# Patient Record
Sex: Female | Born: 1965 | Race: White | Hispanic: No | Marital: Single | Smoking: Former smoker
Health system: Southern US, Community
[De-identification: ages and names within clinical notes are randomized; demographics above are authoritative.]

## PROBLEM LIST (undated history)

## (undated) DIAGNOSIS — I8390 Asymptomatic varicose veins of unspecified lower extremity: Secondary | ICD-10-CM

## (undated) DIAGNOSIS — T7840XA Allergy, unspecified, initial encounter: Secondary | ICD-10-CM

## (undated) DIAGNOSIS — R0602 Shortness of breath: Secondary | ICD-10-CM

## (undated) DIAGNOSIS — J45909 Unspecified asthma, uncomplicated: Secondary | ICD-10-CM

## (undated) DIAGNOSIS — G43909 Migraine, unspecified, not intractable, without status migrainosus: Secondary | ICD-10-CM

## (undated) DIAGNOSIS — M7989 Other specified soft tissue disorders: Secondary | ICD-10-CM

## (undated) DIAGNOSIS — E559 Vitamin D deficiency, unspecified: Secondary | ICD-10-CM

## (undated) DIAGNOSIS — K5900 Constipation, unspecified: Secondary | ICD-10-CM

## (undated) DIAGNOSIS — F32A Depression, unspecified: Secondary | ICD-10-CM

## (undated) HISTORY — PX: OTHER SURGICAL HISTORY: SHX169

## (undated) HISTORY — PX: TUBAL LIGATION: SHX77

## (undated) HISTORY — DX: Asymptomatic varicose veins of unspecified lower extremity: I83.90

## (undated) HISTORY — DX: Other specified soft tissue disorders: M79.89

## (undated) HISTORY — DX: Vitamin D deficiency, unspecified: E55.9

## (undated) HISTORY — DX: Constipation, unspecified: K59.00

## (undated) HISTORY — DX: Shortness of breath: R06.02

## (undated) HISTORY — DX: Allergy, unspecified, initial encounter: T78.40XA

## (undated) HISTORY — DX: Migraine, unspecified, not intractable, without status migrainosus: G43.909

## (undated) HISTORY — DX: Depression, unspecified: F32.A

## (undated) HISTORY — DX: Unspecified asthma, uncomplicated: J45.909

---

## 1998-01-22 ENCOUNTER — Ambulatory Visit (HOSPITAL_COMMUNITY): Admission: RE | Admit: 1998-01-22 | Discharge: 1998-01-22 | Payer: Self-pay | Admitting: Internal Medicine

## 1999-05-22 ENCOUNTER — Other Ambulatory Visit: Admission: RE | Admit: 1999-05-22 | Discharge: 1999-05-22 | Payer: Self-pay | Admitting: Obstetrics and Gynecology

## 1999-06-24 ENCOUNTER — Encounter: Admission: RE | Admit: 1999-06-24 | Discharge: 1999-09-22 | Payer: Self-pay | Admitting: Obstetrics & Gynecology

## 1999-12-09 ENCOUNTER — Encounter (INDEPENDENT_AMBULATORY_CARE_PROVIDER_SITE_OTHER): Payer: Self-pay

## 1999-12-09 ENCOUNTER — Inpatient Hospital Stay (HOSPITAL_COMMUNITY): Admission: AD | Admit: 1999-12-09 | Discharge: 1999-12-11 | Payer: Self-pay | Admitting: Obstetrics and Gynecology

## 2000-01-22 ENCOUNTER — Other Ambulatory Visit: Admission: RE | Admit: 2000-01-22 | Discharge: 2000-01-22 | Payer: Self-pay | Admitting: Obstetrics and Gynecology

## 2001-04-27 ENCOUNTER — Other Ambulatory Visit: Admission: RE | Admit: 2001-04-27 | Discharge: 2001-04-27 | Payer: Self-pay | Admitting: Obstetrics and Gynecology

## 2002-08-18 ENCOUNTER — Other Ambulatory Visit: Admission: RE | Admit: 2002-08-18 | Discharge: 2002-08-18 | Payer: Self-pay | Admitting: Obstetrics and Gynecology

## 2003-09-26 ENCOUNTER — Other Ambulatory Visit: Admission: RE | Admit: 2003-09-26 | Discharge: 2003-09-26 | Payer: Self-pay | Admitting: Obstetrics and Gynecology

## 2004-06-26 ENCOUNTER — Ambulatory Visit: Payer: Self-pay | Admitting: Internal Medicine

## 2004-08-12 ENCOUNTER — Ambulatory Visit: Payer: Self-pay | Admitting: Internal Medicine

## 2005-05-20 ENCOUNTER — Ambulatory Visit: Payer: Self-pay | Admitting: Internal Medicine

## 2005-07-03 ENCOUNTER — Ambulatory Visit: Payer: Self-pay | Admitting: Internal Medicine

## 2005-10-13 ENCOUNTER — Encounter: Admission: RE | Admit: 2005-10-13 | Discharge: 2005-10-13 | Payer: Self-pay | Admitting: Obstetrics and Gynecology

## 2006-02-12 ENCOUNTER — Ambulatory Visit: Payer: Self-pay | Admitting: Internal Medicine

## 2006-02-26 ENCOUNTER — Ambulatory Visit: Payer: Self-pay | Admitting: Internal Medicine

## 2006-03-30 ENCOUNTER — Ambulatory Visit: Payer: Self-pay | Admitting: Internal Medicine

## 2006-10-15 ENCOUNTER — Encounter: Admission: RE | Admit: 2006-10-15 | Discharge: 2006-10-15 | Payer: Self-pay | Admitting: Obstetrics and Gynecology

## 2006-10-20 ENCOUNTER — Ambulatory Visit: Payer: Self-pay | Admitting: Internal Medicine

## 2007-01-03 DIAGNOSIS — J45909 Unspecified asthma, uncomplicated: Secondary | ICD-10-CM | POA: Insufficient documentation

## 2008-01-11 ENCOUNTER — Ambulatory Visit: Payer: Self-pay | Admitting: Internal Medicine

## 2008-01-11 DIAGNOSIS — F17200 Nicotine dependence, unspecified, uncomplicated: Secondary | ICD-10-CM | POA: Insufficient documentation

## 2008-01-11 DIAGNOSIS — H60399 Other infective otitis externa, unspecified ear: Secondary | ICD-10-CM | POA: Insufficient documentation

## 2008-01-11 DIAGNOSIS — G43909 Migraine, unspecified, not intractable, without status migrainosus: Secondary | ICD-10-CM | POA: Insufficient documentation

## 2008-05-23 ENCOUNTER — Encounter: Admission: RE | Admit: 2008-05-23 | Discharge: 2008-05-23 | Payer: Self-pay | Admitting: Obstetrics and Gynecology

## 2010-10-24 ENCOUNTER — Other Ambulatory Visit: Payer: Self-pay | Admitting: Obstetrics and Gynecology

## 2010-10-24 DIAGNOSIS — R928 Other abnormal and inconclusive findings on diagnostic imaging of breast: Secondary | ICD-10-CM

## 2010-10-29 ENCOUNTER — Ambulatory Visit
Admission: RE | Admit: 2010-10-29 | Discharge: 2010-10-29 | Disposition: A | Discharge status: Home / Self Care | Payer: 59 | Source: Ambulatory Visit | Attending: Obstetrics and Gynecology | Admitting: Obstetrics and Gynecology

## 2010-10-29 DIAGNOSIS — R928 Other abnormal and inconclusive findings on diagnostic imaging of breast: Secondary | ICD-10-CM

## 2010-11-28 NOTE — Op Note (Signed)
[facility]  Patient:    Kara King, Kara King                    MRN: [id number] Proc. Date: 12/10/99 Adm. Date:  [id number] Attending:  Minette Headland                           Operative Report  PREOPERATIVE DIAGNOSIS:       Multiparity, desires sterility.  POSTOPERATIVE DIAGNOSIS:      Multiparity, desires sterility.  OPERATION:                    Postpartum bilateral tubal ligation.  SURGEON:                      Juluis Mire, M.D.  ASSISTANT:  ANESTHESIA:                   Epidural.  ESTIMATED BLOOD LOSS:         Minimal.  PACKS AND DRAINS:             None.  BLOOD REPLACED:               None.  COMPLICATIONS:                None.  INDICATIONS:                  The patient is a 45 year old, gravida 3, now para 3, married white female who desires permanent sterilization after recent delivery. The potential irreversibility of sterilization was explained.  Alternative forms of  birth control have been discussed.  A failure rate of 1 in 200 is quoted. Failures can be in the form of ectopic pregnancy.  The risks of the surgery were discussed including the risks of infection, the risks of hemorrhage that could necessitate transfusion with the risk of AIDS or hepatitis, the risk of injury to adjacent organs that could require further exploratory surgery, the risk of deep venous thrombosis and pulmonary embolus.  DESCRIPTION OF PROCEDURE:     The patient was taken to the OR and placed in the  supine position.  After a satisfactory level of epidural anesthesia was obtained, the abdomen was prepped with Betadine and draped as a sterile field.  A subumbilical incision was made with the knife and carried through the subcutaneous tissue.  The fascia was identified and entered sharply and incision to the fascia extended laterally.  The peritoneum was entered sharply.  There was no evidence of injury to adjacent organs.  The left tube  was first identified and followed out to the fimbriated end.  The midsegment was elevated through the incision.  A whole was made in the avascular area of the mesosalpinx using the Bovie.  Individual ligatures of 0 plain catgut were used to ligate off a segment of tube.  The intervening segment of tube was then excised and the cut ends were cauterized. We had good hemostasis.  The left ovary appeared normal.  Next, the right tube was identified and followed out to its fimbriated end.  A midsegment was elevated through the incision.  A whole was made in the avascular area of the mesosalpinx.  Individual ligatures were used to ligate off a segment of tube.  The intervening segment of tube was then excised.  The cut ends of the tube were cauterized using the Bovie.  We  had excellent hemostasis.  The right ovary  appeared to be normal.  We had good hemostasis at this point.  The fascia was closed with a running suture of 0 Vicryl.  The subcu was closed with 0 Vicryl. The skin was closed with a running subcuticular of 2-0 Vicryl.  Sponge, needle, and  instrument counts were correct by circulating nurse x 2.  The patient tolerated  the procedure well and was returned to the recovery room in good condition. DD:  12/10/99 TD:  12/10/99 Job: [id number]

## 2012-12-12 ENCOUNTER — Telehealth: Payer: Self-pay | Admitting: Internal Medicine

## 2012-12-12 NOTE — Telephone Encounter (Signed)
OK 

## 2012-12-12 NOTE — Telephone Encounter (Signed)
Patient states that it has been awhile since she has seen Dr. Alwyn Ren. She says that Dr. Arelia Sneddon at [facility] wants her to be seen by Dr. Alwyn Ren again. Is it okay for patient to see Dr. Alwyn Ren? Thanks!

## 2012-12-12 NOTE — Telephone Encounter (Signed)
Last OV 2009, Hopp please advise if ok to schedule appointment for patient to re-establish

## 2013-01-02 ENCOUNTER — Encounter: Payer: Self-pay | Admitting: Internal Medicine

## 2013-01-02 ENCOUNTER — Ambulatory Visit (INDEPENDENT_AMBULATORY_CARE_PROVIDER_SITE_OTHER): Payer: BC Managed Care – PPO | Admitting: Internal Medicine

## 2013-01-02 VITALS — BP 110/60 | HR 64 | Temp 98.4°F | Ht 67.0 in | Wt 233.0 lb

## 2013-01-02 DIAGNOSIS — I498 Other specified cardiac arrhythmias: Secondary | ICD-10-CM | POA: Insufficient documentation

## 2013-01-02 DIAGNOSIS — Z0000 Encounter for general adult medical examination without abnormal findings: Secondary | ICD-10-CM

## 2013-01-02 LAB — HEPATIC FUNCTION PANEL
ALT: 12 U/L (ref 0–35)
AST: 15 U/L (ref 0–37)
Alkaline Phosphatase: 42 U/L (ref 39–117)
Bilirubin, Direct: 0 mg/dL (ref 0.0–0.3)
Total Protein: 6.1 g/dL (ref 6.0–8.3)

## 2013-01-02 LAB — CBC WITH DIFFERENTIAL/PLATELET
Basophils Relative: 0.7 % (ref 0.0–3.0)
MCHC: 33.2 g/dL (ref 30.0–36.0)
MCV: 90.2 fl (ref 78.0–100.0)
Monocytes Absolute: 0.3 10*3/uL (ref 0.1–1.0)
Monocytes Relative: 6.6 % (ref 3.0–12.0)
Neutrophils Relative %: 63.9 % (ref 43.0–77.0)
RBC: 4.24 Mil/uL (ref 3.87–5.11)
RDW: 14.2 % (ref 11.5–14.6)

## 2013-01-02 LAB — BASIC METABOLIC PANEL
BUN: 12 mg/dL (ref 6–23)
Calcium: 8.7 mg/dL (ref 8.4–10.5)
Glucose, Bld: 90 mg/dL (ref 70–99)

## 2013-01-02 LAB — LIPID PANEL: HDL: 50.8 mg/dL (ref >39.00)

## 2013-01-02 LAB — T4, FREE: Free T4: 0.83 ng/dL (ref 0.60–1.60)

## 2013-01-02 NOTE — Patient Instructions (Addendum)
Preventive Health Care: Exercise  30-45  minutes a day, 3-4 days a week. Walking is especially valuable in preventing Osteoporosis. Eat a low-fat diet with lots of fruits and vegetables, up to 7-9 servings per day. This would eliminate need for vitamin supplements for most individuals. Avoid obesity; your goal = waist less than 35 inches.Consume less than 30 grams of sugar per day from foods & drinks with High Fructose Corn Syrup as #2,3 or #4 on label. To prevent palpitations or premature beats, avoid stimulants such as decongestants, diet pills, nicotine, or caffeine (coffee, tea, cola, or chocolate) to excess.

## 2013-01-02 NOTE — Progress Notes (Signed)
  Subjective:    Patient ID: Kara King, female    DOB: [birth year], 47 y.o.   MRN: [id number]  HPI  She is here to re-establish primary care;acute issues are noted below.     Review of Systems She is on a modified heart healthy diet; she has been  exercising 35 minutes on a treadmill in past 10 days ;but plans to work with a Psychologist, educational . To date  she denies chest pain,  dyspnea, or claudication with exercise.  She occasionally has palpitations at rest, especially at night. She does not have exercise-induced palpitations. She is on a diet supplement for > 2 months. She denies caffeine intake. In 2008 EKG did reveal unifocal PVC with a bigeminal pattern. There was also poor R wave progression across the precordium. Family history is negative for premature coronary disease .     Objective:   Physical Exam Gen.: Well-nourished in appearance. Alert, appropriate and cooperative throughout exam. Head: Normocephalic without obvious abnormalities Eyes: No corneal or conjunctival inflammation noted. No ptosis ,lid lag or proptosis. Extraocular motion intact. Vision grossly normal without lenses Ears: External  ear exam reveals no significant lesions or deformities. Canals clear .TMs normal. Hearing is grossly normal bilaterally. Nose: External nasal exam reveals no deformity or inflammation. Nasal mucosa are pink and moist. No lesions or exudates noted.   Mouth: Oral mucosa and oropharynx reveal no lesions or exudates. Teeth in good repair. Neck: No deformities, masses, or tenderness noted. Range of motion & Thyroid normal. Lungs: Normal respiratory effort; chest expands symmetrically. Lungs are clear to auscultation without rales, wheezes, or increased work of breathing. Heart: Normal rate and rhythm. Normal S1 and S2. No gallop, click, or rub. S4 w/o murmur. Abdomen: Bowel sounds normal; abdomen soft and nontender. No masses, organomegaly or hernias noted. Genitalia: As per Gyn                                   Musculoskeletal/extremities: No deformity or scoliosis noted of  the thoracic or lumbar spine.  No clubbing, cyanosis, edema, or significant extremity  deformity noted. Range of motion normal .Tone & strength  Normal. Joints normal . Nail health good. Able to lie down & sit up w/o help. Negative SLR bilaterally Vascular: Carotid, radial artery, dorsalis pedis and  posterior tibial pulses are full and equal. No bruits present. Neurologic: Alert and oriented x3. Deep tendon reflexes symmetrical and normal.       Skin: Intact without suspicious lesions or rashes. Lymph: No cervical, axillary lymphadenopathy present. Psych: Mood and affect are normal. Normally interactive                                                                                        Assessment & Plan:  #1 comprehensive physical exam; no acute findings  Plan: see Orders  & Recommendations

## 2013-01-03 ENCOUNTER — Encounter: Payer: Self-pay | Admitting: *Deleted

## 2013-01-17 ENCOUNTER — Ambulatory Visit: Payer: BC Managed Care – PPO | Admitting: Internal Medicine

## 2013-01-18 ENCOUNTER — Ambulatory Visit (INDEPENDENT_AMBULATORY_CARE_PROVIDER_SITE_OTHER)
Admission: RE | Admit: 2013-01-18 | Discharge: 2013-01-18 | Disposition: A | Discharge status: Home / Self Care | Payer: BC Managed Care – PPO | Source: Ambulatory Visit | Attending: Internal Medicine | Admitting: Internal Medicine

## 2013-01-18 ENCOUNTER — Encounter: Payer: Self-pay | Admitting: Internal Medicine

## 2013-01-18 ENCOUNTER — Ambulatory Visit (INDEPENDENT_AMBULATORY_CARE_PROVIDER_SITE_OTHER): Payer: BC Managed Care – PPO | Admitting: Internal Medicine

## 2013-01-18 VITALS — BP 118/72 | HR 61 | Temp 98.6°F | Wt 233.0 lb

## 2013-01-18 DIAGNOSIS — R946 Abnormal results of thyroid function studies: Secondary | ICD-10-CM

## 2013-01-18 DIAGNOSIS — R0601 Orthopnea: Secondary | ICD-10-CM

## 2013-01-18 DIAGNOSIS — R002 Palpitations: Secondary | ICD-10-CM

## 2013-01-18 LAB — D-DIMER, QUANTITATIVE: D-Dimer, Quant: 0.64 ug/mL-FEU — ABNORMAL HIGH (ref 0.00–0.48)

## 2013-01-18 NOTE — Patient Instructions (Addendum)
Order for x-rays entered into  the computer; these will be performed at [address] across from [facility]. No appointment is necessary. 

## 2013-01-18 NOTE — Progress Notes (Signed)
  Subjective:    Patient ID: Kara King, female    DOB: [birth year], 47 y.o.   MRN: [id number]  HPI Her symptoms began 01/12/13 as palpitations which lasted several hours. She was seen in urgent care; no blood work or EKG was performed. It was recommended she return for followup here.  Since that visit; she's had "labored breathing" for several hours every afternoon. She does not describe associated palpitations. It will resolve without treatment  She states that last night she did have some palpitations which were not associated with shortness of breath.  She does have a past history of asthma.  She had extensive lab studies done 01/02/13 which were normal. Her TSH was low normal at 0.4; free T4 was normal . She is no longer taking the  appetite suppressant phentermine.   Review of Systems  She does not have significant cough, wheezing, sputum production,edema , calf pain or hemoptysis.     Objective:   Physical Exam Appears well-nourished & in no acute distress  No carotid bruits are present.No neck pain distention present at 10 - 15 degrees. Thyroid normal to palpation  Heart rhythm and rate are normal with no significant murmurs or gallops.Distant heart sounds  Chest is clear with no increased work of breathing  There is no evidence of aortic aneurysm or renal artery bruits  Abdomen soft with no organomegaly or masses. No HJR  No clubbing, cyanosis or edema present.Homan's negative  Pedal pulses are intact   No ischemic skin changes are present . Nails healthy    Alert and oriented. Strength, tone, DTRs reflexes normal          Assessment & Plan:  #1 orthopnea  #2 paroxysmal palpitations  #3 borderline normal thyroid function tests  Plan: See orders and recommendations. If these studies are negative; pulmonary function test would be recommended.

## 2013-01-19 ENCOUNTER — Encounter: Payer: Self-pay | Admitting: *Deleted

## 2013-01-19 ENCOUNTER — Encounter: Payer: Self-pay | Admitting: Internal Medicine

## 2013-01-19 LAB — T4, FREE: Free T4: 0.91 ng/dL (ref 0.60–1.60)

## 2013-01-19 LAB — T3, FREE: T3, Free: 2 pg/mL — ABNORMAL LOW (ref 2.3–4.2)

## 2013-01-20 ENCOUNTER — Ambulatory Visit (INDEPENDENT_AMBULATORY_CARE_PROVIDER_SITE_OTHER): Payer: BC Managed Care – PPO

## 2013-01-20 DIAGNOSIS — R002 Palpitations: Secondary | ICD-10-CM

## 2013-01-20 NOTE — Progress Notes (Signed)
Placed a 24 hr holter monitor on patient

## 2013-01-29 ENCOUNTER — Encounter: Payer: Self-pay | Admitting: Internal Medicine

## 2013-01-30 ENCOUNTER — Encounter: Payer: Self-pay | Admitting: Internal Medicine

## 2013-05-18 ENCOUNTER — Other Ambulatory Visit: Payer: Self-pay

## 2014-01-25 IMAGING — CR DG CHEST 2V
2 series · 2 of 2 positions shown · non-contrast
Comparison: None.

CLINICAL DATA: Orthopnea, shortness of breath

CHEST - 2 VIEW

[view not recorded (1 of 2)]
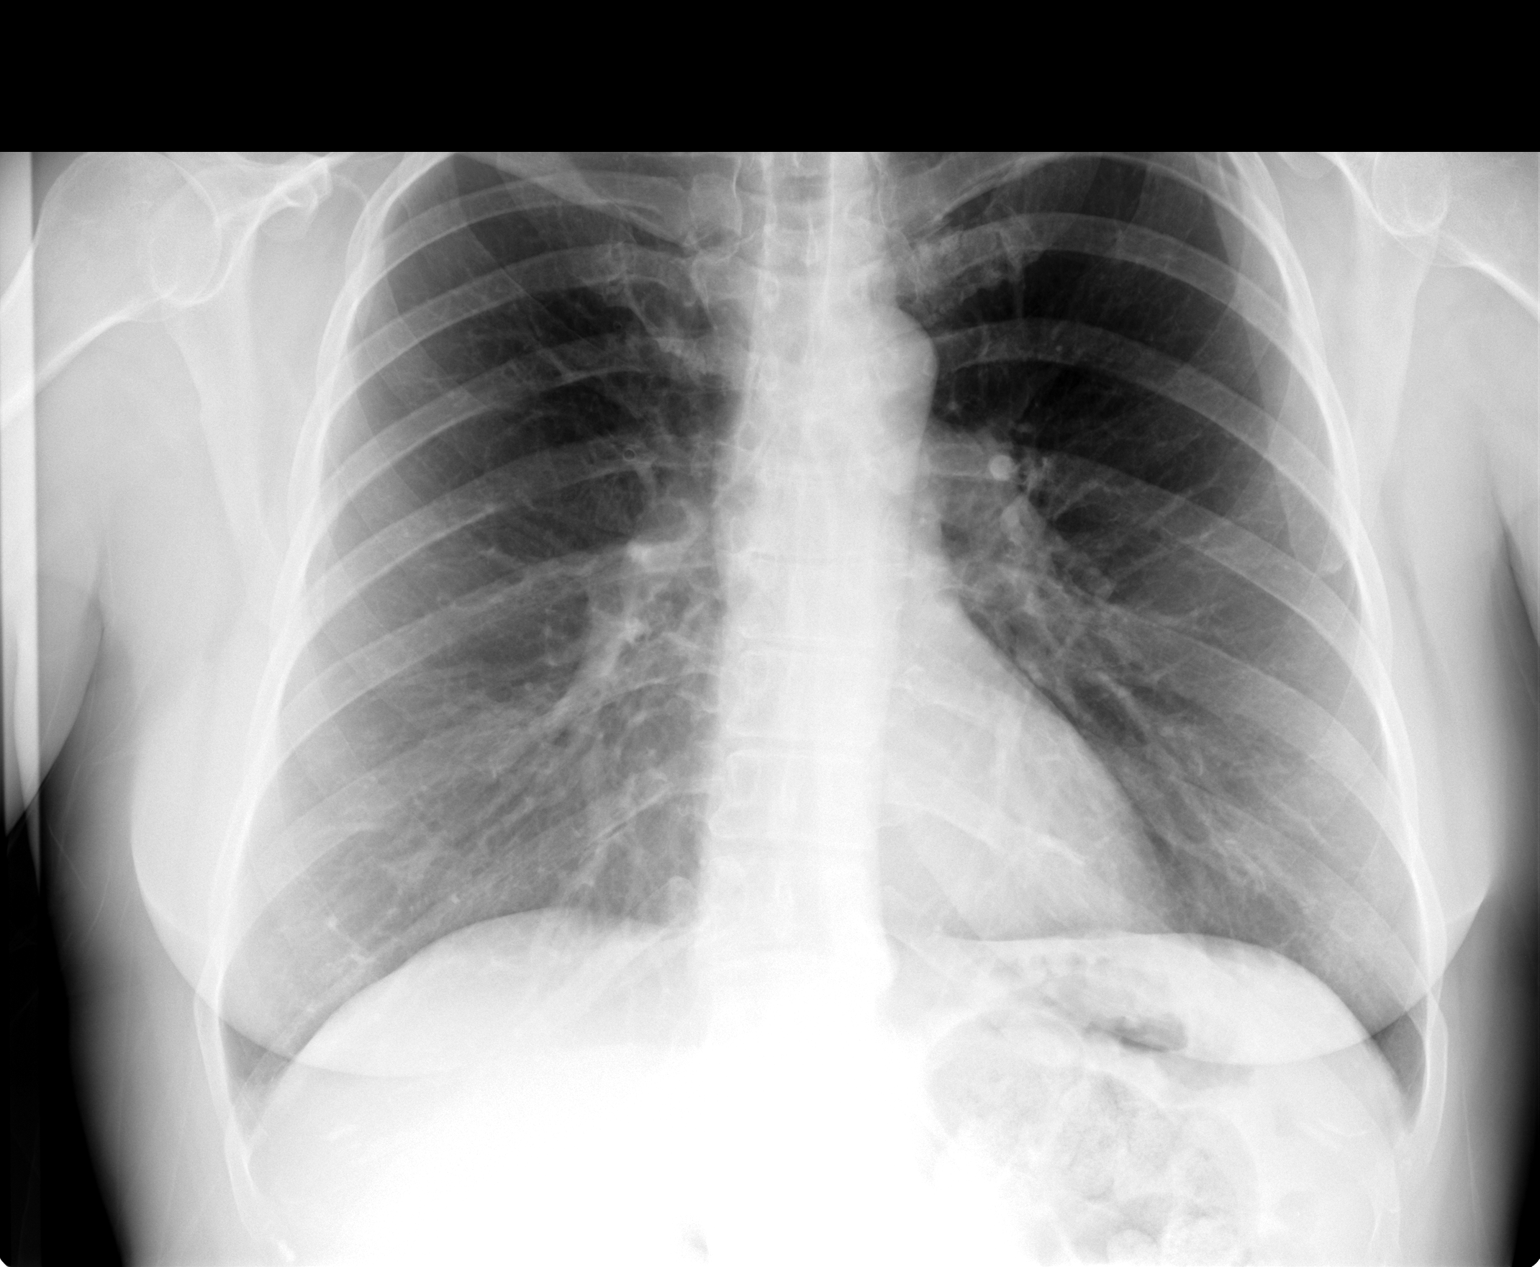

[view not recorded (2 of 2)]
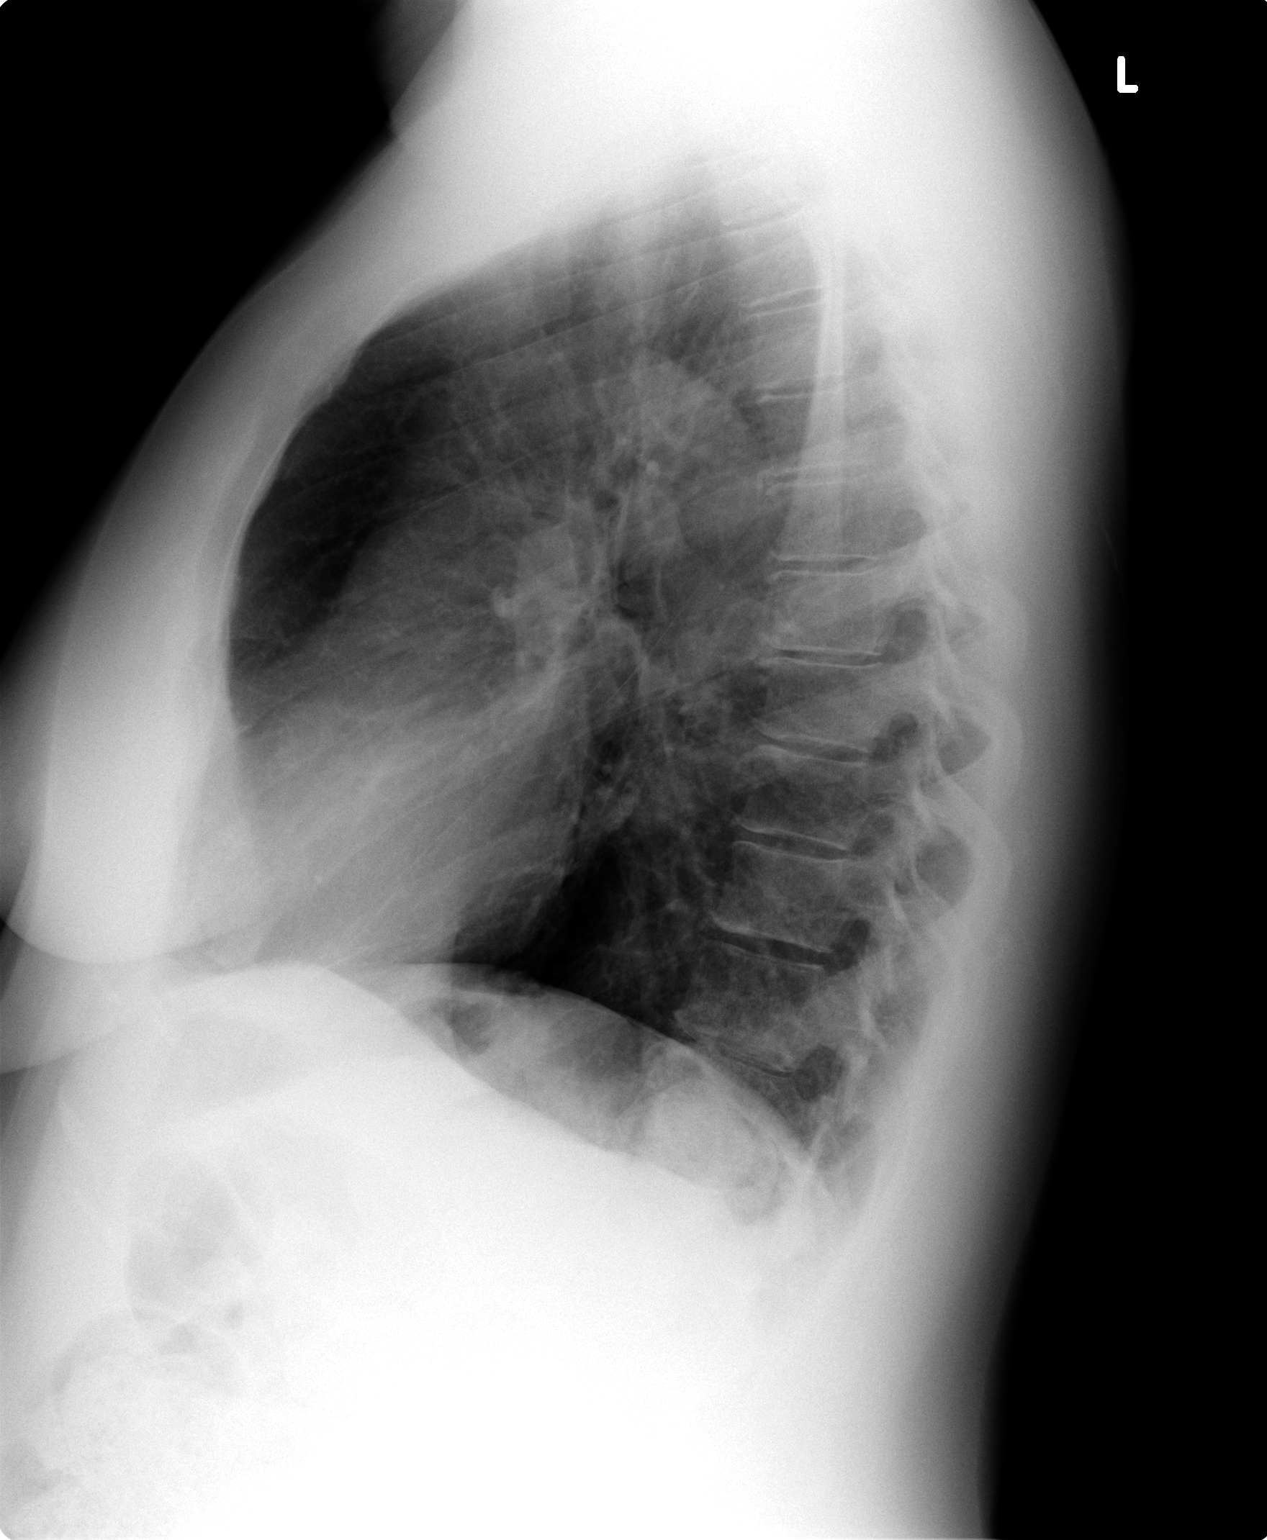

[2 of 2 positions shown; findings below may reference images not displayed]

FINDINGS: Cardiomediastinal silhouette is unremarkable.  No acute
infiltrate or pleural effusion.  No pulmonary edema.  Bony thorax
is unremarkable.
IMPRESSION: No active disease.

## 2015-08-14 LAB — HM MAMMOGRAPHY: HM Mammogram: NORMAL (ref 0–4)

## 2015-08-14 LAB — HM PAP SMEAR: HM PAP: NORMAL

## 2015-09-01 ENCOUNTER — Ambulatory Visit (INDEPENDENT_AMBULATORY_CARE_PROVIDER_SITE_OTHER): Payer: BLUE CROSS/BLUE SHIELD | Admitting: Family Medicine

## 2015-09-01 VITALS — BP 95/60 | HR 50 | Temp 97.3°F | Resp 18 | Ht 67.0 in | Wt 198.8 lb

## 2015-09-01 DIAGNOSIS — J4520 Mild intermittent asthma, uncomplicated: Secondary | ICD-10-CM | POA: Diagnosis not present

## 2015-09-01 DIAGNOSIS — R001 Bradycardia, unspecified: Secondary | ICD-10-CM | POA: Diagnosis not present

## 2015-09-01 DIAGNOSIS — J069 Acute upper respiratory infection, unspecified: Secondary | ICD-10-CM | POA: Diagnosis not present

## 2015-09-01 DIAGNOSIS — J301 Allergic rhinitis due to pollen: Secondary | ICD-10-CM

## 2015-09-01 MED ORDER — PSEUDOEPHEDRINE HCL ER 120 MG PO TB12: 120.0000 mg | ORAL_TABLET | Freq: Two times a day (BID) | ORAL | Status: DC | PRN

## 2015-09-01 MED ORDER — FLUTICASONE PROPIONATE 50 MCG/ACT NA SUSP: 2.0000 | Freq: Every day | NASAL | Status: DC

## 2015-09-01 MED ORDER — AMOXICILLIN 500 MG PO TABS: 1000.0000 mg | ORAL_TABLET | Freq: Two times a day (BID) | ORAL | Status: DC

## 2015-09-01 NOTE — Patient Instructions (Signed)
1  Upper Respiratory Infection, Adult Most upper respiratory infections (URIs) are a viral infection of the air passages leading to the lungs. A URI affects the nose, throat, and upper air passages. The most common type of URI is nasopharyngitis and is typically referred to as "the common cold." URIs run their course and usually go away on their own. Most of the time, a URI does not require medical attention, but sometimes a bacterial infection in the upper airways can follow a viral infection. This is called a secondary infection. Sinus and middle ear infections are common types of secondary upper respiratory infections. Bacterial pneumonia can also complicate a URI. A URI can worsen asthma and chronic obstructive pulmonary disease (COPD). Sometimes, these complications can require emergency medical care and may be life threatening.  CAUSES Almost all URIs are caused by viruses. A virus is a type of germ and can spread from one person to another.  RISKS FACTORS You may be at risk for a URI if:   You smoke.   You have chronic heart or lung disease.  You have a weakened defense (immune) system.   You are very young or very old.   You have nasal allergies or asthma.  You work in crowded or poorly ventilated areas.  You work in health care facilities or schools. SIGNS AND SYMPTOMS  Symptoms typically develop 2-3 days after you come in contact with a cold virus. Most viral URIs last 7-10 days. However, viral URIs from the influenza virus (flu virus) can last 14-18 days and are typically more severe. Symptoms may include:   Runny or stuffy (congested) nose.   Sneezing.   Cough.   Sore throat.   Headache.   Fatigue.   Fever.   Loss of appetite.   Pain in your forehead, behind your eyes, and over your cheekbones (sinus pain).  Muscle aches.  DIAGNOSIS  Your health care provider may diagnose a URI by:  Physical exam.  Tests to check that your symptoms are not  due to another condition such as:  Strep throat.  Sinusitis.  Pneumonia.  Asthma. TREATMENT  A URI goes away on its own with time. It cannot be cured with medicines, but medicines may be prescribed or recommended to relieve symptoms. Medicines may help:  Reduce your fever.  Reduce your cough.  Relieve nasal congestion. HOME CARE INSTRUCTIONS   Take medicines only as directed by your health care provider.   Gargle warm saltwater or take cough drops to comfort your throat as directed by your health care provider.  Use a warm mist humidifier or inhale steam from a shower to increase air moisture. This may make it easier to breathe.  Drink enough fluid to keep your urine clear or pale yellow.   Eat soups and other clear broths and maintain good nutrition.   Rest as needed.   Return to work when your temperature has returned to normal or as your health care provider advises. You may need to stay home longer to avoid infecting others. You can also use a face mask and careful hand washing to prevent spread of the virus.  Increase the usage of your inhaler if you have asthma.   Do not use any tobacco products, including cigarettes, chewing tobacco, or electronic cigarettes. If you need help quitting, ask your health care provider. PREVENTION  The best way to protect yourself from getting a cold is to practice good hygiene.   Avoid oral or hand contact with people  with cold symptoms.   Wash your hands often if contact occurs.  There is no clear evidence that vitamin C, vitamin E, echinacea, or exercise reduces the chance of developing a cold. However, it is always recommended to get plenty of rest, exercise, and practice good nutrition.  SEEK MEDICAL CARE IF:   You are getting worse rather than better.   Your symptoms are not controlled by medicine.   You have chills.  You have worsening shortness of breath.  You have brown or red mucus.  You have yellow or  brown nasal discharge.  You have pain in your face, especially when you bend forward.  You have a fever.  You have swollen neck glands.  You have pain while swallowing.  You have white areas in the back of your throat. SEEK IMMEDIATE MEDICAL CARE IF:   You have severe or persistent:  Headache.  Ear pain.  Sinus pain.  Chest pain.  You have chronic lung disease and any of the following:  Wheezing.  Prolonged cough.  Coughing up blood.  A change in your usual mucus.  You have a stiff neck.  You have changes in your:  Vision.  Hearing.  Thinking.  Mood. MAKE SURE YOU:   Understand these instructions.  Will watch your condition.  Will get help right away if you are not doing well or get worse.   This information is not intended to replace advice given to you by your health care provider. Make sure you discuss any questions you have with your health care provider.   Document Released: 12/23/2000 Document Revised: 11/13/2014 Document Reviewed: 10/04/2013 Elsevier Interactive Patient Education [employer].

## 2015-09-01 NOTE — Progress Notes (Signed)
Subjective:    Patient ID: Kara King, female    DOB: 11-Jul-1966, 50 y.o.   MRN: [id number]  09/01/2015  Shortness of Breath and Cough   HPI This 50 y.o. female presents for evaluation of cough, shortness of breath.  History of Asthma.  Onset five days ago.  No fever/chills/sweats.  No body aches.  No headache.  No ear pain or sore throat.  Ears are congested.  +rhinorrhea; +nasal congestion; +sneezing.  Congestion is clear.  +coughing mild.  +PND.  +no wheezing.  +SOB; onset yesterday. No chest tightness.  No v/d.  Allergies as a child but have been good for the past several years.  SOB occurs with exertion.  No medications for symptoms. No tobacco.  History of tobacco abuse; quit five years ago.    Heart rate on FitBit in 50s for several months.  Does not check BP; denies dizziness.  Feels fine.  Review of Systems  Constitutional: Negative for fever, chills, diaphoresis and fatigue.  HENT: Positive for congestion, postnasal drip, rhinorrhea and sneezing. Negative for ear pain, sinus pressure, sore throat and trouble swallowing.   Respiratory: Positive for cough and shortness of breath. Negative for chest tightness and wheezing.   Cardiovascular: Negative for chest pain, palpitations and leg swelling.  Gastrointestinal: Negative for nausea, vomiting, abdominal pain, diarrhea and constipation.  Neurological: Negative for dizziness and headaches.    Past Medical History  Diagnosis Date  . Migraine headache   . Asthma   . Allergy    Past Surgical History  Procedure Laterality Date  . Tubal ligation    . G 3 p 3     Allergies  Allergen Reactions  . Wellbutrin [Bupropion]     01/30/13 dyspnea (MyChart message)    Social History   Social History  . Marital Status: Single    Spouse Name: N/A  . Number of Children: N/A  . Years of Education: N/A   Occupational History  . Not on file.   Social History Main Topics  . Smoking status: Former Smoker    Types: Cigarettes      Quit date: 01/03/2011  . Smokeless tobacco: Not on file     Comment: smoked age [age] (not with pregnancies , up to 2 ppd  . Alcohol Use: No  . Drug Use: No  . Sexual Activity: No   Other Topics Concern  . Not on file   Social History Narrative   Family History  Problem Relation Age of Onset  . Adopted: Yes  . Alcohol abuse Father   . Heart attack Brother 42     smoker  . Heart disease Brother   . Hypertension Brother   . Uterine cancer Maternal Grandmother   . Cancer Maternal Grandmother   . Stroke Paternal Grandmother     > 65  . Diabetes Paternal Grandmother        Objective:    BP 95/60 mmHg  Pulse 50  Temp(Src) 97.3 F (36.3 C) (Oral)  Resp 18  Ht 5\' 7"  (1.702 m)  Wt 198 lb 12.8 oz (90.175 kg)  BMI 31.13 kg/m2  SpO2 98%  LMP 08/07/2015 Physical Exam  Constitutional: She is oriented to person, place, and time. She appears well-developed and well-nourished. No distress.  HENT:  Head: Normocephalic and atraumatic.  Right Ear: Tympanic membrane, external ear and ear canal normal.  Left Ear: Tympanic membrane, external ear and ear canal normal.  Nose: Nose normal.  Eyes: Conjunctivae are normal. Pupils  are equal, round, and reactive to light.  Neck: Normal range of motion. Neck supple. No thyromegaly present.  Cardiovascular: Regular rhythm and normal heart sounds.  Bradycardia present.  Exam reveals no gallop and no friction rub.   No murmur heard. Pulmonary/Chest: Effort normal and breath sounds normal. No respiratory distress. She has no wheezes. She has no rales.  Lymphadenopathy:    She has no cervical adenopathy.  Neurological: She is alert and oriented to person, place, and time.  Skin: She is not diaphoretic.  Psychiatric: She has a normal mood and affect. Her behavior is normal.  Nursing note and vitals reviewed.  Results for orders placed or performed in visit on 01/18/13  TSH  Result Value Ref Range   TSH 0.87 0.35 - 5.50 uIU/mL  T4, free   Result Value Ref Range   Free T4 0.91 0.60 - 1.60 ng/dL  T3, free  Result Value Ref Range   T3, Free 2.0 (L) 2.3 - 4.2 pg/mL  D-dimer, quantitative  Result Value Ref Range   D-Dimer, Quant 0.64 (H) 0.00 - 0.48 ug/mL-FEU   PEAK FLOW 450.    Assessment & Plan:   1. Acute upper respiratory infection   2. Allergic rhinitis due to pollen   3. Asthma, mild intermittent, uncomplicated   4. Bradycardia    1. URI: New.  Treat supportively with Pseudoephedrine.  If no improvement in 3-5 days, start amoxicillin. Consistent with viral syndrome at this time. 2.  Allergic Rhinitis: New. Rx for Flonase provided; cannot tolerate antihistamines. 3.  Asthma mild intermittent: stable; current mild exacerbation; declined Albuterol PRN.  Pt declined CBC today to rule out anemia. 4.  Bradycardia: New. Asymptomatic.  Recommend follow-up with PCP for labs including thyroid panel; patient agreeable; declined labs today.    No orders of the defined types were placed in this encounter.   Meds ordered this encounter  Medications  . pseudoephedrine (SUDAFED) 120 MG 12 hr tablet    Sig: Take 1 tablet (120 mg total) by mouth every 12 (twelve) hours as needed for congestion.    Dispense:  14 tablet    Refill:  0  . amoxicillin (AMOXIL) 500 MG tablet    Sig: Take 2 tablets (1,000 mg total) by mouth 2 (two) times daily.    Dispense:  40 tablet    Refill:  0  . fluticasone (FLONASE) 50 MCG/ACT nasal spray    Sig: Place 2 sprays into both nostrils daily.    Dispense:  16 g    Refill:  6    No Follow-up on file.    Kristi Elayne Guerin, M.D. [facility] [address] [place], [place]  [address] [phone] phone [phone] fax

## 2015-09-19 ENCOUNTER — Other Ambulatory Visit: Payer: Self-pay | Admitting: *Deleted

## 2015-09-19 DIAGNOSIS — I83813 Varicose veins of bilateral lower extremities with pain: Secondary | ICD-10-CM

## 2015-09-30 ENCOUNTER — Encounter: Payer: Self-pay | Admitting: Surgery

## 2015-10-04 ENCOUNTER — Ambulatory Visit (INDEPENDENT_AMBULATORY_CARE_PROVIDER_SITE_OTHER): Payer: BLUE CROSS/BLUE SHIELD | Admitting: Surgery

## 2015-10-04 ENCOUNTER — Encounter: Payer: Self-pay | Admitting: Surgery

## 2015-10-04 ENCOUNTER — Ambulatory Visit (HOSPITAL_COMMUNITY)
Admission: RE | Admit: 2015-10-04 | Discharge: 2015-10-04 | Disposition: A | Discharge status: Home / Self Care | Payer: BLUE CROSS/BLUE SHIELD | Source: Ambulatory Visit | Attending: Surgery | Admitting: Surgery

## 2015-10-04 VITALS — BP 116/55 | HR 41 | Temp 97.9°F | Resp 16 | Ht 66.0 in | Wt 188.0 lb

## 2015-10-04 DIAGNOSIS — I872 Venous insufficiency (chronic) (peripheral): Secondary | ICD-10-CM | POA: Diagnosis not present

## 2015-10-04 DIAGNOSIS — I83813 Varicose veins of bilateral lower extremities with pain: Secondary | ICD-10-CM

## 2015-10-04 NOTE — Progress Notes (Signed)
Patient name: Kara King MRN: [id number] DOB: [birth year] Sex: female   Referred by: self  Reason for referral:  Chief Complaint  Patient presents with  . New Evaluation    bilateral varicose veins    HISTORY OF PRESENT ILLNESS: Is is a 50 year old female who comes in today for evaluation of varicose veins.  The patient states that her legs have been bothering her for many years, the left greater than right.  We are associated with swelling and aching, particularly when she has been on her feet for long time.  The swelling also happens when she dangles her legs.  She has a prominent varicosity on the right lateral calf with spider veins on the left lateral calf and a varicosity on the anterior left lower leg.  She denies a history of DVT.  Past Medical History  Diagnosis Date  . Migraine headache   . Asthma   . Allergy   . Varicose veins     Past Surgical History  Procedure Laterality Date  . Tubal ligation    . G 3 p 3      Social History   Social History  . Marital Status: Single    Spouse Name: N/A  . Number of Children: N/A  . Years of Education: N/A   Occupational History  . Not on file.   Social History Main Topics  . Smoking status: Former Smoker    Types: Cigarettes    Quit date: 01/03/2011  . Smokeless tobacco: Never Used     Comment: smoked age [age] (not with pregnancies , up to 2 ppd  . Alcohol Use: No  . Drug Use: No  . Sexual Activity: No   Other Topics Concern  . Not on file   Social History Narrative    Family History  Problem Relation Age of Onset  . Adopted: Yes  . Alcohol abuse Father   . Heart attack Brother 42     smoker  . Heart disease Brother   . Hypertension Brother   . Uterine cancer Maternal Grandmother   . Cancer Maternal Grandmother   . Stroke Paternal Grandmother     > 65  . Diabetes Paternal Grandmother     Allergies as of 10/04/2015 - Review Complete 10/04/2015  Allergen Reaction Noted  . Wellbutrin  [bupropion]  01/30/2013    Current Outpatient Prescriptions on File Prior to Visit  Medication Sig Dispense Refill  . rizatriptan (MAXALT) 10 MG tablet Take 10 mg by mouth as needed for migraine. Reported on 09/01/2015    . amoxicillin (AMOXIL) 500 MG tablet Take 2 tablets (1,000 mg total) by mouth 2 (two) times daily. (Patient not taking: Reported on 10/04/2015) 40 tablet 0  . buPROPion (WELLBUTRIN XL) 300 MG 24 hr tablet Take 300 mg by mouth daily. Reported on 10/04/2015    . fluticasone (FLONASE) 50 MCG/ACT nasal spray Place 2 sprays into both nostrils daily. (Patient not taking: Reported on 10/04/2015) 16 g 6  . pseudoephedrine (SUDAFED) 120 MG 12 hr tablet Take 1 tablet (120 mg total) by mouth every 12 (twelve) hours as needed for congestion. (Patient not taking: Reported on 10/04/2015) 14 tablet 0   No current facility-administered medications on file prior to visit.     REVIEW OF SYSTEMS: Cardiovascular: No chest pain, chest pressure,Positive for shortness of breath with activity area positive for leg swelling Pulmonary: No productive cough, asthma or wheezing. Neurologic: No weakness, paresthesias, aphasia, or amaurosis. No dizziness. Hematologic:  No bleeding problems or clotting disorders. Musculoskeletal: No joint pain or joint swelling. Gastrointestinal: No blood in stool or hematemesis Genitourinary: No dysuria or hematuria. Psychiatric:: No history of major depression. Integumentary: No rashes or ulcers. Constitutional: No fever or chills.  PHYSICAL EXAMINATION:  Filed Vitals:   10/04/15 1114  BP: 116/55  Pulse: 41  Temp: 97.9 F (36.6 C)  Resp: 16  Height: 5\' 6"  (1.676 m)  Weight: 188 lb (85.276 kg)  SpO2: 99%   Body mass index is 30.36 kg/(m^2). General: The patient appears their stated age.   HEENT:  No gross abnormalities Pulmonary: Respirations are non-labored Musculoskeletal: There are no major deformities.   Neurologic: No focal weakness or paresthesias are  detected, Skin: There are no ulcer or rashes noted. Psychiatric: The patient has normal affect. Cardiovascular: There is a regular rate and rhythm without significant murmur appreciated.Palpable pedal pulses.  Varicosity along the right lateral calf.  Spider veins on the left lateral calf with a varicosity anteriorly Palpable pedal pulses Diagnostic Studies: I have reviewed her duplex ultrasound.  There is no evidence of DVT.  Venous incompetence is noted in bilateral common femoral vein, the right saphenofemoral junction, and the left popliteal vein.  There is also a possible a incompetent perforator in the right posterior lateral distal thigh with adjacent varicosities    Assessment:  Chronic venous insufficiency Plan: I discussed with the patient that she does not have significant superficial venous reflux to warrant endovenous laser ablation.  The spider vein and varicosities could be treated on a cosmetic basis.  She is going to contemplate this.  I have asked her to start wearing compression stockings so as to minimize complications from chronic venous insufficiency.  She'll contact us on an as-needed basis if she wants to pursue treatment of her venous issues.     Eldridge Abrahams, M.D. [facility] Office: [phone] Pager:  [phone]

## 2016-08-12 ENCOUNTER — Telehealth: Payer: Self-pay | Admitting: Internal Medicine

## 2016-08-12 NOTE — Telephone Encounter (Signed)
Patient is a hopper patient her daughter see you and is wanting to transfer to you. Please advise

## 2016-08-13 NOTE — Telephone Encounter (Signed)
Ok for her --- but unable to take anymore new

## 2016-09-21 ENCOUNTER — Ambulatory Visit (INDEPENDENT_AMBULATORY_CARE_PROVIDER_SITE_OTHER): Payer: BLUE CROSS/BLUE SHIELD | Admitting: Family Medicine

## 2016-09-21 ENCOUNTER — Encounter: Payer: Self-pay | Admitting: Family Medicine

## 2016-09-21 VITALS — BP 102/62 | HR 46 | Temp 98.3°F | Ht 66.0 in | Wt 209.2 lb

## 2016-09-21 DIAGNOSIS — S76302A Unspecified injury of muscle, fascia and tendon of the posterior muscle group at thigh level, left thigh, initial encounter: Secondary | ICD-10-CM

## 2016-09-21 DIAGNOSIS — R001 Bradycardia, unspecified: Secondary | ICD-10-CM

## 2016-09-21 NOTE — Progress Notes (Signed)
Musculoskeletal Exam  Patient: Kara King DOB: May 14, 1966  DOS: 09/21/2016  SUBJECTIVE:  Chief Complaint:   Chief Complaint  Patient presents with  . Knee Pain    (L)-since Sept-pt states possible injury    Kara King is a 51 y.o.  female for evaluation and treatment of L knee pain. She is a new patient who was formerly seen by Dr. Linna Darner, who retired. She is going to establish with Dr. Carollee Herter in April, but wanted to be seen prior to that for her knee.  Onset:  6 months ago. No injury, she had recently started doing boot camp workouts that were higher intensity than her yoga that she was doing previously.  Location: L posterior (medial) knee Character:  aching  Progression of issue:  has worsened slightly Associated symptoms: None- it does not catch or lock on her, she is not having any bruising or swelling Palliation: rest, avoidance of activity Provocation: Boot camp workout Treatment: to date has been activity modification.   Neurovascular symptoms: no  Bradycardia Has always been told her heart rate is low. She is not on a beta blocker. She is not having any chest pain or shortness of breath.   ROS: Musculoskeletal/Extremities: +L knee pain Neurologic: no numbness, tingling no weakness   Past Medical History:  Diagnosis Date  . Allergy   . Asthma   . Migraine headache   . Varicose veins    Past Surgical History:  Procedure Laterality Date  . G 3 P 3    . TUBAL LIGATION     Family History  Problem Relation Age of Onset  . Adopted: Yes  . Alcohol abuse Father   . Heart attack Brother 42     smoker  . Heart disease Brother   . Hypertension Brother   . Uterine cancer Maternal Grandmother   . Cancer Maternal Grandmother   . Stroke Paternal Grandmother     > 65  . Diabetes Paternal Grandmother    Current Outpatient Prescriptions  Medication Sig Dispense Refill  . Multiple Vitamin (MULTIVITAMIN) tablet Take 1 tablet by mouth daily.    .  rizatriptan (MAXALT) 10 MG tablet Take 10 mg by mouth as needed for migraine. Reported on 09/01/2015     Allergies  Allergen Reactions  . Wellbutrin [Bupropion]     01/30/13 dyspnea (MyChart message)   Social History   Social History  . Marital status: Single   Social History Main Topics  . Smoking status: Former Smoker    Types: Cigarettes    Quit date: 01/03/2011  . Smokeless tobacco: Never Used     Comment: smoked age [age] (not with pregnancies , up to 2 ppd  . Alcohol use No  . Drug use: No  . Sexual activity: No   Objective: VITAL SIGNS: BP 102/62 (BP Location: Left Arm, Patient Position: Sitting, Cuff Size: Large)   Pulse (!) 46   Temp 98.3 F (36.8 C) (Oral)   Ht 5\' 6"  (1.676 m)   Wt 209 lb 3.2 oz (94.9 kg)   LMP 08/26/2016 (Exact Date)   SpO2 98%   BMI 33.77 kg/m  Constitutional: Well formed, well developed. No acute distress. Cardiovascular: Reg rhythm, bradycardic, no bruits, no LE edema Thorax & Lungs: CTAB. No accessory muscle use Extremities: No clubbing. No cyanosis. No edema.  Skin: Warm. Dry. No erythema. No rash.  Musculoskeletal: L knee.   Normal active range of motion: yes.   Normal passive range of motion: yes Tenderness  to palpation: yes over medial distal hamstring; there was also pain illicited with knee flexion over same area Effusion: No Deformity: no Ecchymosis: no Tests positive: None Tests negative: Patellar grind/apprehension, Lachman's, anterior/posterior drawer, McMurray's, Stine's,  Neurologic: Normal sensory function. No focal deficits noted.  Psychiatric: Normal mood. Age appropriate judgment and insight. Alert & oriented x 3.    Assessment:  Left hamstring injury, initial encounter - Plan: Ambulatory referral to Physical Therapy  Plan: Orders as above. As this has been going on for 6 mo, will refer to PT. Home stretches and exercises given in meantime. Do what can tolerate only. Ibuprofen/acetaminophen for pain. Try to stay  active.  F/u prn or with reg future PCP otherwise.  The patient voiced understanding and agreement to the plan.   [place], DO 09/21/16  8:38 AM

## 2016-09-21 NOTE — Progress Notes (Signed)
Pre visit review using our clinic review tool, if applicable. No additional management support is needed unless otherwise documented below in the visit note. 

## 2016-09-21 NOTE — Patient Instructions (Addendum)
If you don't hear anything in the next 1-2 weeks about physical therapy, call us to ask for an update.   Hamstring Strain Rehab Ask your health care provider which exercises are safe for you. Do exercises exactly as told by your health care provider and adjust them as directed. It is normal to feel mild stretching, pulling, tightness, or discomfort as you do these exercises, but you should stop right away if you feel sudden pain or your pain gets worse.Do not begin these exercises until told by your health care provider. Strengthening exercises These exercises build strength and endurance in your thighs. Endurance is the ability to use your muscles for a long time, even after your muscles get tired. Exercise A: Straight leg raises (  hip extensors) 1. Lie on your belly on a firm surface. 2. Tense the muscles in your buttocks to lift your left / right leg about 4 inches (10 cm). Keep your knee straight. 3. If you cannot lift your leg this high without arching your back, place a pillow under your hips. 4. Hold the position for 15 seconds. 5. Slowly lower your leg to the starting position and allow it to relax completely before you start the next repetition. Repeat 2-3 times. Complete this exercise one time a day. Exercise B: Bridge ( hip extensors) 1. Lie on your back on a firm surface with your knees bent and your feet flat on the floor. 2. Tighten your buttocks muscles and lift your bottom off the floor until your trunk is level with your thighs.  You should feel the muscles working in your buttocks and the back of your thighs.  Do not arch your back. 3. Hold this position for 15 seconds. 4. Slowly lower your hips to the starting position. 5. Let your buttocks muscles relax completely between repetitions. Repeat 2-3 times. Complete this exercise 1 time a day. If told by your health care provider, keep your bottom lifted off the floor while you slowly walk your feet away from you as far as  you can control. Hold for 15 seconds, then slowly walk your feet back toward you. Exercise C: Lateral walking with band ( hip abductors) 1. Stand in a long hallway. 2. Wrap a loop of exercise band around your legs, just above your knees. 3. Bend your knees gently and drop your hips down and back so your weight is over your heels. 4. Step to the side to move down the length of the hallway, keeping your toes pointed ahead of you and keeping tension in the band. 5. Repeat, leading with your other leg. Repeat 2times. Complete this exercise every other day. Exercise D: Single leg stand with reaching ( eccentric hamstring) 1. Stand on your left / right foot. Keep your big toe down on the floor and try to keep your arch lifted. 2. Slowly reach down toward the floor as far as you can while keeping your balance. 3. Hold this position for 10-15 seconds. Repeat 2-3 times. Complete this exercise every other day. Exercise E: Prone plank (abdominals and core) 1. Lie on your belly on the floor and prop yourself up on your elbows. Your hands should be straight out in front of you, and your elbows should be below your shoulders. Position your feet so the balls of your feet touch the ground. The ball of your foot is on the walking surface, right under your toes. 2. Tighten your abdominal muscles and lift your body off the floor.  Do  not arch your back.  Do not hold your breath. 3. Hold this position for 10-15 seconds. Repeat 2-3 times. Complete this exercise every other day. This information is not intended to replace advice given to you by your health care provider. Make sure you discuss any questions you have with your health care provider. Document Released: 06/29/2005 Document Revised: 03/05/2016 Document Reviewed: 03/28/2015 Elsevier Interactive Patient Education  2017 [employer].

## 2016-10-15 ENCOUNTER — Encounter: Payer: Self-pay | Admitting: Behavioral Health

## 2016-10-15 ENCOUNTER — Telehealth: Payer: Self-pay | Admitting: Behavioral Health

## 2016-10-15 NOTE — Telephone Encounter (Signed)
Pre-Visit Call completed with patient and chart updated.   Pre-Visit Info documented in Specialty Comments under [employer].    

## 2016-10-16 ENCOUNTER — Ambulatory Visit (INDEPENDENT_AMBULATORY_CARE_PROVIDER_SITE_OTHER): Payer: BLUE CROSS/BLUE SHIELD | Admitting: Family Medicine

## 2016-10-16 ENCOUNTER — Encounter: Payer: Self-pay | Admitting: Family Medicine

## 2016-10-16 VITALS — BP 108/66 | HR 57 | Temp 98.4°F | Resp 16 | Ht 66.0 in | Wt 213.6 lb

## 2016-10-16 DIAGNOSIS — Z23 Encounter for immunization: Secondary | ICD-10-CM | POA: Diagnosis not present

## 2016-10-16 DIAGNOSIS — G43909 Migraine, unspecified, not intractable, without status migrainosus: Secondary | ICD-10-CM | POA: Diagnosis not present

## 2016-10-16 DIAGNOSIS — F320 Major depressive disorder, single episode, mild: Secondary | ICD-10-CM

## 2016-10-16 MED ORDER — VENLAFAXINE HCL ER 37.5 MG PO CP24: ORAL_CAPSULE | ORAL | 0 refills | Status: DC

## 2016-10-16 NOTE — Progress Notes (Signed)
Pre visit review using our clinic review tool, if applicable. No additional management support is needed unless otherwise documented below in the visit note. 

## 2016-10-16 NOTE — Progress Notes (Signed)
Patient ID: Kara King, female   DOB: 1966-03-29, 51 y.o.   MRN: [id number]     Subjective:  I acted as a Education administrator for Dr. Carollee Herter.  Guerry Bruin, [place]    Patient ID: Kara King, female    DOB: 07-15-1965, 51 y.o.   MRN: [id number]  Chief Complaint  Patient presents with  . transfer from Dr. Linna Darner  . would like to start antidepressant  . Migraine    HPI  Patient is in today to establish care.  She is a transfer from Dr. Linna Darner.   She has a history of migraines.  She has been waking up with migraines.  She has been getting about 4 migraines out of a month.   Has been taking Excedrin every day and  because of pain.  She would like to try an antidepressant because she has trouble completing tasks.  She feels like she is dragging.    Patient Care Team: Ann Held, DO as PCP - General (Family Medicine) Juanda Chance, NP as Consulting Physician (Obstetrics and Gynecology)   Past Medical History:  Diagnosis Date  . Allergy   . Asthma   . Migraine headache   . Varicose veins     Past Surgical History:  Procedure Laterality Date  . G 3 P 3    . TUBAL LIGATION      Family History  Problem Relation Age of Onset  . Adopted: Yes  . Alcohol abuse Father   . Heart attack Brother 42     smoker  . Heart disease Brother   . Hypertension Brother   . Uterine cancer Maternal Grandmother   . Cancer Maternal Grandmother   . Stroke Paternal Grandmother     > 65  . Diabetes Paternal Grandmother     Social History   Social History  . Marital status: Single    Spouse name: N/A  . Number of children: N/A  . Years of education: N/A   Occupational History  .      powers firearms   Social History Main Topics  . Smoking status: Former Smoker    Types: Cigarettes    Quit date: 01/03/2011  . Smokeless tobacco: Never Used     Comment: smoked age [age] (not with pregnancies , up to 2 ppd  . Alcohol use No  . Drug use: No  . Sexual activity: No   Other Topics  Concern  . Not on file   Social History Narrative  . No narrative on file    Outpatient Medications Prior to Visit  Medication Sig Dispense Refill  . Multiple Vitamin (MULTIVITAMIN) tablet Take 1 tablet by mouth daily.    . rizatriptan (MAXALT) 10 MG tablet Take 10 mg by mouth as needed for migraine. Reported on 09/01/2015     No facility-administered medications prior to visit.     Allergies  Allergen Reactions  . Wellbutrin [Bupropion]     01/30/13 dyspnea (MyChart message)    Review of Systems  Constitutional: Negative for fever and malaise/fatigue.  HENT: Negative for congestion.   Eyes: Negative for blurred vision.  Respiratory: Negative for cough and shortness of breath.   Cardiovascular: Negative for chest pain, palpitations and leg swelling.  Gastrointestinal: Negative for vomiting.  Musculoskeletal: Negative for back pain.  Skin: Negative for rash.  Neurological: Negative for loss of consciousness and headaches.       Objective:    Physical Exam  Constitutional: She is oriented to person, place,  and time. She appears well-developed and well-nourished. No distress.  HENT:  Head: Normocephalic and atraumatic.  Eyes: Conjunctivae are normal.  Neck: Normal range of motion. No thyromegaly present.  Cardiovascular: Normal rate and regular rhythm.   Pulmonary/Chest: Effort normal and breath sounds normal. She has no wheezes.  Abdominal: Soft. Bowel sounds are normal. There is no tenderness.  Musculoskeletal: Normal range of motion. She exhibits no edema or deformity.  Neurological: She is alert and oriented to person, place, and time.  Skin: Skin is warm and dry. She is not diaphoretic.  Psychiatric: She has a normal mood and affect.    BP 108/66 (BP Location: Left Arm, Cuff Size: Large)   Pulse (!) 57   Temp 98.4 F (36.9 C) (Oral)   Resp 16   Ht 5\' 6"  (1.676 m)   Wt 213 lb 9.6 oz (96.9 kg)   LMP 09/30/2016   SpO2 98%   BMI 34.48 kg/m  Wt Readings from  Last 3 Encounters:  10/16/16 213 lb 9.6 oz (96.9 kg)  09/21/16 209 lb 3.2 oz (94.9 kg)  10/04/15 188 lb (85.3 kg)   BP Readings from Last 3 Encounters:  10/16/16 108/66  09/21/16 102/62  10/04/15 (!) 116/55     Immunization History  Administered Date(s) Administered  . Zoster Recombinat (Shingrix) 10/16/2016    Health Maintenance  Topic Date Due  . HIV Screening  12/26/1980  . TETANUS/TDAP  12/26/1984  . COLONOSCOPY  12/27/2015  . INFLUENZA VACCINE  02/10/2017  . MAMMOGRAM  08/13/2017  . PAP SMEAR  08/13/2018    Lab Results  Component Value Date   WBC 5.2 01/02/2013   HGB 12.7 01/02/2013   HCT 38.3 01/02/2013   PLT 248.0 01/02/2013   GLUCOSE 90 01/02/2013   CHOL 149 01/02/2013   TRIG 85.0 01/02/2013   HDL 50.80 01/02/2013   LDLCALC 81 01/02/2013   ALT 12 01/02/2013   AST 15 01/02/2013   NA 140 01/02/2013   K 3.8 01/02/2013   CL 108 01/02/2013   CREATININE 0.8 01/02/2013   BUN 12 01/02/2013   CO2 30 01/02/2013   TSH 0.87 01/18/2013    Lab Results  Component Value Date   TSH 0.87 01/18/2013   Lab Results  Component Value Date   WBC 5.2 01/02/2013   HGB 12.7 01/02/2013   HCT 38.3 01/02/2013   MCV 90.2 01/02/2013   PLT 248.0 01/02/2013   Lab Results  Component Value Date   NA 140 01/02/2013   K 3.8 01/02/2013   CO2 30 01/02/2013   GLUCOSE 90 01/02/2013   BUN 12 01/02/2013   CREATININE 0.8 01/02/2013   BILITOT 0.4 01/02/2013   ALKPHOS 42 01/02/2013   AST 15 01/02/2013   ALT 12 01/02/2013   PROT 6.1 01/02/2013   ALBUMIN 3.5 01/02/2013   CALCIUM 8.7 01/02/2013   GFR 88.03 01/02/2013   Lab Results  Component Value Date   CHOL 149 01/02/2013   Lab Results  Component Value Date   HDL 50.80 01/02/2013   Lab Results  Component Value Date   LDLCALC 81 01/02/2013   Lab Results  Component Value Date   TRIG 85.0 01/02/2013   Lab Results  Component Value Date   CHOLHDL 3 01/02/2013   No results found for: HGBA1C       Assessment &  Plan:   Problem List Items Addressed This Visit      Unprioritized   Depression, major, single episode, mild ([place])  effexor xr per orders Pt asked about focusing and concentration-- she was given names of psych for testing and will consider this option      Relevant Medications   venlafaxine XR (EFFEXOR XR) 37.5 MG 24 hr capsule   Migraine without status migrainosus, not intractable - Primary    Try effexor xr per orders rto 1 month      Relevant Medications   venlafaxine XR (EFFEXOR XR) 37.5 MG 24 hr capsule    Other Visit Diagnoses    Need for shingles vaccine       Relevant Orders   Varicella-zoster vaccine IM (Shingrix) (Completed)      I am having Ms. North start on venlafaxine XR. I am also having her maintain her rizatriptan, multivitamin, and GLUCOSAMINE-CHONDROITIN PO.  Meds ordered this encounter  Medications  . GLUCOSAMINE-CHONDROITIN PO    Sig: Take 1 capsule by mouth 2 (two) times daily.  [name] venlafaxine XR (EFFEXOR XR) 37.5 MG 24 hr capsule    Sig: Take 1 capsules every morning for 2 weeks, then increase to 2 capsules every day thereafter    Dispense:  60 capsule    Refill:  0    CMA served as scribe during this visit. History, Physical and Plan performed by medical provider. Documentation and orders reviewed and attested to.  Ann Held, DO

## 2016-10-16 NOTE — Patient Instructions (Signed)

## 2016-10-17 DIAGNOSIS — F320 Major depressive disorder, single episode, mild: Secondary | ICD-10-CM | POA: Insufficient documentation

## 2016-10-17 NOTE — Assessment & Plan Note (Signed)
effexor xr per orders Pt asked about focusing and concentration-- she was given names of psych for testing and will consider this option

## 2016-10-17 NOTE — Assessment & Plan Note (Signed)
Try effexor xr per orders rto 1 month

## 2016-11-13 ENCOUNTER — Ambulatory Visit: Payer: BLUE CROSS/BLUE SHIELD | Admitting: Family Medicine

## 2016-12-18 ENCOUNTER — Encounter: Payer: Self-pay | Admitting: Family

## 2016-12-18 ENCOUNTER — Ambulatory Visit (HOSPITAL_BASED_OUTPATIENT_CLINIC_OR_DEPARTMENT_OTHER)
Admission: RE | Admit: 2016-12-18 | Discharge: 2016-12-18 | Disposition: A | Discharge status: Home / Self Care | Payer: BLUE CROSS/BLUE SHIELD | Source: Ambulatory Visit | Attending: Family | Admitting: Family

## 2016-12-18 ENCOUNTER — Ambulatory Visit (INDEPENDENT_AMBULATORY_CARE_PROVIDER_SITE_OTHER): Payer: BLUE CROSS/BLUE SHIELD | Admitting: Family

## 2016-12-18 ENCOUNTER — Telehealth: Payer: Self-pay | Admitting: Family

## 2016-12-18 VITALS — BP 94/58 | HR 55 | Temp 98.7°F | Resp 18 | Ht 66.0 in

## 2016-12-18 DIAGNOSIS — M25472 Effusion, left ankle: Secondary | ICD-10-CM | POA: Diagnosis not present

## 2016-12-18 DIAGNOSIS — M25572 Pain in left ankle and joints of left foot: Secondary | ICD-10-CM | POA: Insufficient documentation

## 2016-12-18 DIAGNOSIS — R937 Abnormal findings on diagnostic imaging of other parts of musculoskeletal system: Secondary | ICD-10-CM | POA: Insufficient documentation

## 2016-12-18 NOTE — Progress Notes (Signed)
   Subjective:    Patient ID: Kara King, female    DOB: 03/22/1966, 51 y.o.   MRN: [id number]  HPI  Kara King is a 51 yr old female who presents today with chief complaint of left ankle pain. Pain began last night after she missed the last step coming down the stairs on her outdoor steps.  Notes that her ankle twisted and she felt some "crunching" inside her ankle when she fell.   Review of Systems See HPI  Past Medical History:  Diagnosis Date  . Allergy   . Asthma   . Migraine headache   . Varicose veins      Social History   Social History  . Marital status: Single    Spouse name: N/A  . Number of children: N/A  . Years of education: N/A   Occupational History  .      powers firearms   Social History Main Topics  . Smoking status: Former Smoker    Types: Cigarettes    Quit date: 01/03/2011  . Smokeless tobacco: Never Used     Comment: smoked age [age] (not with pregnancies , up to 2 ppd  . Alcohol use No  . Drug use: No  . Sexual activity: No   Other Topics Concern  . Not on file   Social History Narrative  . No narrative on file    Past Surgical History:  Procedure Laterality Date  . G 3 P 3    . TUBAL LIGATION      Family History  Problem Relation Age of Onset  . Adopted: Yes  . Alcohol abuse Father   . Heart attack Brother 42        smoker  . Heart disease Brother   . Hypertension Brother   . Uterine cancer Maternal Grandmother   . Cancer Maternal Grandmother   . Stroke Paternal Grandmother        > 65  . Diabetes Paternal Grandmother     Allergies  Allergen Reactions  . Wellbutrin [Bupropion]     01/30/13 dyspnea (MyChart message)  . Effexor [Venlafaxine]     "Confusion"    Current Outpatient Prescriptions on File Prior to Visit  Medication Sig Dispense Refill  . GLUCOSAMINE-CHONDROITIN PO Take 1 capsule by mouth 2 (two) times daily.    . Multiple Vitamin (MULTIVITAMIN) tablet Take 1 tablet by mouth daily.    . rizatriptan  (MAXALT) 10 MG tablet Take 10 mg by mouth as needed for migraine. Reported on 09/01/2015     No current facility-administered medications on file prior to visit.     BP (!) 94/58 (BP Location: Right Arm, Cuff Size: Large)   Pulse (!) 55   Temp 98.7 F (37.1 C) (Oral)   Resp 18   Ht 5\' 6"  (1.676 m)   LMP 09/30/2016   SpO2 98%       Objective:   Physical Exam  Constitutional: She is oriented to person, place, and time. She appears well-developed and well-nourished. No distress.  Musculoskeletal:  L ankle swollen, tender laterally, some ecchymosis is noted.   Neurological: She is alert and oriented to person, place, and time.  Skin:  Scrapes noted bilateral knees  Psychiatric: She has a normal mood and affect. Her behavior is normal. Thought content normal.          Assessment & Plan:  Left ankle pain- rule out fracture. If + for fracture, plan referral to orthopedics.

## 2016-12-18 NOTE — Patient Instructions (Signed)
Please complete x ray on the first floor. We will contact you with the results.

## 2016-12-18 NOTE — Telephone Encounter (Signed)
X ray does show small fracture of her talus bone.  I suspect ortho will put her in a Boot. I would recommend that she go to ortho urgent care this evening- they open 5:30-9 at [facility] in [place] and their number is [phone].

## 2016-12-19 ENCOUNTER — Telehealth: Payer: Self-pay | Admitting: Family Medicine

## 2016-12-19 NOTE — Telephone Encounter (Signed)
Spoke to patient.  Advised her of results.  Advised her to go to ortho urgent care today. They are open until 2pm but told patient to be there no later than 1:30 for registration.  Patient verbalizes understanding.

## 2016-12-19 NOTE — Telephone Encounter (Signed)
[facility] for results on her X-Rays. The patient hasn't heard from anyone on the results from her X-rays on 12/18/16. She needs to know if she has a sprained or broken bone and what does she need to do. Please advise

## 2016-12-21 NOTE — Telephone Encounter (Signed)
We took care of it ---  Should not have been sent in phone note on a saturday D/w [place]

## 2017-02-19 ENCOUNTER — Ambulatory Visit (INDEPENDENT_AMBULATORY_CARE_PROVIDER_SITE_OTHER): Payer: BLUE CROSS/BLUE SHIELD | Admitting: Behavioral Health

## 2017-02-19 DIAGNOSIS — Z23 Encounter for immunization: Secondary | ICD-10-CM | POA: Diagnosis not present

## 2017-02-19 NOTE — Progress Notes (Signed)
Pre visit review using our clinic review tool, if applicable. No additional management support is needed unless otherwise documented below in the visit note.  Patient came in clinic for Shingrix vaccination #2. IM injection was given in the left deltoid. Patient tolerated injection well.

## 2017-06-12 ENCOUNTER — Ambulatory Visit: Payer: BLUE CROSS/BLUE SHIELD | Admitting: Primary Care

## 2017-06-12 ENCOUNTER — Encounter: Payer: Self-pay | Admitting: Primary Care

## 2017-06-12 VITALS — BP 120/68 | HR 61 | Temp 98.2°F | Ht 66.0 in | Wt 223.0 lb

## 2017-06-12 DIAGNOSIS — J209 Acute bronchitis, unspecified: Secondary | ICD-10-CM

## 2017-06-12 MED ORDER — AZITHROMYCIN 250 MG PO TABS: ORAL_TABLET | ORAL | 0 refills | Status: DC

## 2017-06-12 MED ORDER — ALBUTEROL SULFATE HFA 108 (90 BASE) MCG/ACT IN AERS: 2.0000 | INHALATION_SPRAY | Freq: Four times a day (QID) | RESPIRATORY_TRACT | 0 refills | Status: DC | PRN

## 2017-06-12 NOTE — Patient Instructions (Signed)
Start Azithromycin antibiotics. Take 2 tablets by mouth today, then 1 tablet daily for 4 additional days.  Shortness of Breath/Wheezing/Cough: Use the albuterol inhaler. Inhale 2 puffs into the lungs every 6 hours as needed for wheezing and/or shortness of breath.   Ensure you are staying hydrated with water and rest.  It was a pleasure meeting you!

## 2017-06-12 NOTE — Progress Notes (Signed)
Subjective:    Patient ID: Kara King, female    DOB: 12/02/1965, 51 y.o.   MRN: [id number]  HPI  Kara King is a 51 year old female who presents today with a chief complaint of cough. She also reports chest congestion, coughing spells, and shortness of breath. Her cough is productive with thick yellow mucous, this tastes like metal. Her symptoms began 10 days ago, started feeling worse two days ago. She denies fevers, sore throat. She's not taken anything OTC for her symptoms. Overall she's feeling worse.   Review of Systems  Constitutional: Positive for fatigue. Negative for fever.  HENT: Positive for congestion. Negative for ear pain, sinus pressure and sore throat.   Respiratory: Positive for cough and shortness of breath.   Cardiovascular: Negative for chest pain.       Past Medical History:  Diagnosis Date  . Allergy   . Asthma   . Migraine headache   . Varicose veins      Social History   Socioeconomic History  . Marital status: Single    Spouse name: Not on file  . Number of children: Not on file  . Years of education: Not on file  . Highest education level: Not on file  Social Needs  . Financial resource strain: Not on file  . Food insecurity - worry: Not on file  . Food insecurity - inability: Not on file  . Transportation needs - medical: Not on file  . Transportation needs - non-medical: Not on file  Occupational History    Comment: [employer]  Tobacco Use  . Smoking status: Former Smoker    Types: Cigarettes    Last attempt to quit: 01/03/2011    Years since quitting: 6.4  . Smokeless tobacco: Never Used  . Tobacco comment: smoked age [age] (not with pregnancies , up to 2 ppd  Substance and Sexual Activity  . Alcohol use: No    Alcohol/week: 0.0 oz  . Drug use: No  . Sexual activity: No    Partners: Female  Other Topics Concern  . Not on file  Social History Narrative  . Not on file    Past Surgical History:  Procedure Laterality Date   . G 3 P 3    . TUBAL LIGATION      Family History  Adopted: Yes  Problem Relation Age of Onset  . Alcohol abuse Father   . Heart attack Brother 42        smoker  . Heart disease Brother   . Hypertension Brother   . Uterine cancer Maternal Grandmother   . Cancer Maternal Grandmother   . Stroke Paternal Grandmother        > 65  . Diabetes Paternal Grandmother     Allergies  Allergen Reactions  . Wellbutrin [Bupropion]     01/30/13 dyspnea (MyChart message)  . Effexor [Venlafaxine]     "Confusion"    Current Outpatient Medications on File Prior to Visit  Medication Sig Dispense Refill  . GLUCOSAMINE-CHONDROITIN PO Take 1 capsule by mouth 2 (two) times daily.    . Multiple Vitamin (MULTIVITAMIN) tablet Take 1 tablet by mouth daily.    . rizatriptan (MAXALT) 10 MG tablet Take 10 mg by mouth as needed for migraine. Reported on 09/01/2015     No current facility-administered medications on file prior to visit.     BP 120/68   Pulse 61   Temp 98.2 F (36.8 C) (Oral)   Ht 5'  6" (1.676 m)   Wt 223 lb (101.2 kg)   SpO2 97%   BMI 35.99 kg/m    Objective:   Physical Exam  Constitutional: She appears well-nourished. She appears ill.  HENT:  Right Ear: Tympanic membrane and ear canal normal.  Left Ear: Tympanic membrane and ear canal normal.  Nose: Mucosal edema present. Right sinus exhibits no maxillary sinus tenderness and no frontal sinus tenderness. Left sinus exhibits no maxillary sinus tenderness and no frontal sinus tenderness.  Mouth/Throat: Oropharynx is clear and moist.  Eyes: Conjunctivae are normal.  Neck: Neck supple.  Cardiovascular: Normal rate and regular rhythm.  Pulmonary/Chest: Effort normal. She has no wheezes. She has rhonchi in the right upper field and the right lower field. She has no rales.  Lymphadenopathy:    She has no cervical adenopathy.  Skin: Skin is warm and dry.          Assessment & Plan:  Acute Bronchitis:  Cough, congestion,  fatigue x 10 days, worse now. Exam today with mild rhonchi to right upper and lower fields, consistent for acute bronchitis.  Given duration of symptoms coupled with exam, will treat. Rx for Zpak sent to pharmacy, and albuterol sent to pharmacy for coughing spells and SOB. She kindly declines any Rx cough suppressant. Fluids, rest, follow up PRN.  Sheral Flow, NP

## 2017-06-23 ENCOUNTER — Other Ambulatory Visit: Payer: Self-pay

## 2017-06-23 DIAGNOSIS — J209 Acute bronchitis, unspecified: Secondary | ICD-10-CM

## 2017-06-23 MED ORDER — AZITHROMYCIN 250 MG PO TABS: ORAL_TABLET | ORAL | 0 refills | Status: DC

## 2017-06-29 ENCOUNTER — Ambulatory Visit: Payer: BLUE CROSS/BLUE SHIELD | Admitting: Family Medicine

## 2017-06-29 ENCOUNTER — Encounter: Payer: Self-pay | Admitting: Family Medicine

## 2017-06-29 VITALS — BP 126/70 | HR 56 | Temp 98.1°F | Resp 16 | Ht 66.0 in | Wt 229.4 lb

## 2017-06-29 DIAGNOSIS — J209 Acute bronchitis, unspecified: Secondary | ICD-10-CM

## 2017-06-29 DIAGNOSIS — J4541 Moderate persistent asthma with (acute) exacerbation: Secondary | ICD-10-CM | POA: Diagnosis not present

## 2017-06-29 MED ORDER — METHYLPREDNISOLONE ACETATE 80 MG/ML IJ SUSP
80.0000 mg | Freq: Once | INTRAMUSCULAR | Status: AC
  Administered 2017-06-29: 80 mg via INTRAMUSCULAR

## 2017-06-29 MED ORDER — PREDNISONE 10 MG PO TABS: ORAL_TABLET | ORAL | 0 refills | Status: DC

## 2017-06-29 MED ORDER — HYDROCOD POLST-CPM POLST ER 10-8 MG/5ML PO SUER: 5.0000 mL | Freq: Every evening | ORAL | Status: DC | PRN

## 2017-06-29 MED ORDER — HYDROCOD POLST-CPM POLST ER 10-8 MG/5ML PO SUER: 5.0000 mL | Freq: Every evening | ORAL | 0 refills | Status: DC | PRN

## 2017-06-29 MED ORDER — ALBUTEROL SULFATE HFA 108 (90 BASE) MCG/ACT IN AERS: 2.0000 | INHALATION_SPRAY | Freq: Four times a day (QID) | RESPIRATORY_TRACT | 0 refills | Status: DC | PRN

## 2017-06-29 NOTE — Patient Instructions (Signed)

## 2017-06-29 NOTE — Progress Notes (Signed)
Patient ID: Kara King, female   DOB: October 07, 1965, 51 y.o.   MRN: [id number]     Subjective:  I acted as a Education administrator for Dr. Carollee Herter.  Kara King, Kara King   Patient ID: Kara King, female    DOB: [birth year], 51 y.o.   MRN: [id number]  Chief Complaint  Patient presents with  . Cough  . Shortness of Breath    HPI  Patient is in today for follow up from visit from 121/18 at [facility].  She still having trouble with shortness of breath and cough.  Patient Care Team: Carollee Herter, Alferd Apa, DO as PCP - General (Family Medicine) Juanda Chance, NP as Consulting Physician (Obstetrics and Gynecology)   Past Medical History:  Diagnosis Date  . Allergy   . Asthma   . Migraine headache   . Varicose veins     Past Surgical History:  Procedure Laterality Date  . G 3 P 3    . TUBAL LIGATION      Family History  Adopted: Yes  Problem Relation Age of Onset  . Alcohol abuse Father   . Heart attack Brother 42        smoker  . Heart disease Brother   . Hypertension Brother   . Uterine cancer Maternal Grandmother   . Cancer Maternal Grandmother   . Stroke Paternal Grandmother        > 65  . Diabetes Paternal Grandmother     Social History   Socioeconomic History  . Marital status: Single    Spouse name: Not on file  . Number of children: Not on file  . Years of education: Not on file  . Highest education level: Not on file  Social Needs  . Financial resource strain: Not on file  . Food insecurity - worry: Not on file  . Food insecurity - inability: Not on file  . Transportation needs - medical: Not on file  . Transportation needs - non-medical: Not on file  Occupational History    Comment: [employer]  Tobacco Use  . Smoking status: Former Smoker    Types: Cigarettes    Last attempt to quit: 01/03/2011    Years since quitting: 6.4  . Smokeless tobacco: Never Used  . Tobacco comment: smoked age [age] (not with pregnancies , up to 2 ppd  Substance  and Sexual Activity  . Alcohol use: No    Alcohol/week: 0.0 oz  . Drug use: No  . Sexual activity: No    Partners: Female  Other Topics Concern  . Not on file  Social History Narrative  . Not on file    Outpatient Medications Prior to Visit  Medication Sig Dispense Refill  . azithromycin (ZITHROMAX) 250 MG tablet Take 2 tablets by mouth today, then 1 tablet daily for 4 additional days. 6 tablet 0  . GLUCOSAMINE-CHONDROITIN PO Take 1 capsule by mouth 2 (two) times daily.    . Multiple Vitamin (MULTIVITAMIN) tablet Take 1 tablet by mouth daily.    . rizatriptan (MAXALT) 10 MG tablet Take 10 mg by mouth as needed for migraine. Reported on 09/01/2015    . albuterol (PROVENTIL HFA;VENTOLIN HFA) 108 (90 Base) MCG/ACT inhaler Inhale 2 puffs into the lungs every 6 (six) hours as needed for shortness of breath (cough). 1 Inhaler 0   No facility-administered medications prior to visit.     Allergies  Allergen Reactions  . Wellbutrin [Bupropion]     01/30/13 dyspnea (MyChart message)  .  Effexor [Venlafaxine]     "Confusion"    Review of Systems  Constitutional: Negative for fever and malaise/fatigue.  HENT: Negative for congestion.   Eyes: Negative for blurred vision.  Respiratory: Positive for cough and shortness of breath.   Cardiovascular: Negative for chest pain, palpitations and leg swelling.  Gastrointestinal: Negative for vomiting.  Musculoskeletal: Negative for back pain.  Skin: Negative for rash.  Neurological: Negative for loss of consciousness and headaches.       Objective:    Physical Exam  Constitutional: She is oriented to person, place, and time. She appears well-developed and well-nourished.  HENT:  Right Ear: External ear normal.  Left Ear: External ear normal.  + PND + errythema  Eyes: Conjunctivae are normal. Right eye exhibits no discharge. Left eye exhibits no discharge.  Cardiovascular: Normal rate, regular rhythm and normal heart sounds.  No murmur  heard. Pulmonary/Chest: Effort normal. No respiratory distress. She has decreased breath sounds. She has no wheezes. She has no rales. She exhibits no tenderness.  Musculoskeletal: She exhibits no edema.  Lymphadenopathy:    She has cervical adenopathy.  Neurological: She is alert and oriented to person, place, and time.  Nursing note and vitals reviewed.   BP 126/70 (BP Location: Right Arm, Cuff Size: Large)   Pulse (!) 56   Temp 98.1 F (36.7 C) (Oral)   Resp 16   Ht 5\' 6"  (1.676 m)   Wt 229 lb 6.4 oz (104.1 kg)   SpO2 98%   BMI 37.03 kg/m  Wt Readings from Last 3 Encounters:  06/29/17 229 lb 6.4 oz (104.1 kg)  06/12/17 223 lb (101.2 kg)  10/16/16 213 lb 9.6 oz (96.9 kg)   BP Readings from Last 3 Encounters:  06/29/17 126/70  06/12/17 120/68  12/18/16 (!) 94/58     Immunization History  Administered Date(s) Administered  . Zoster Recombinat (Shingrix) 10/16/2016, 02/19/2017    Health Maintenance  Topic Date Due  . HIV Screening  12/26/1980  . TETANUS/TDAP  12/26/1984  . COLONOSCOPY  12/27/2015  . INFLUENZA VACCINE  03/01/2018 (Originally 02/10/2017)  . MAMMOGRAM  08/13/2017  . PAP SMEAR  08/13/2018    Lab Results  Component Value Date   WBC 5.2 01/02/2013   HGB 12.7 01/02/2013   HCT 38.3 01/02/2013   PLT 248.0 01/02/2013   GLUCOSE 90 01/02/2013   CHOL 149 01/02/2013   TRIG 85.0 01/02/2013   HDL 50.80 01/02/2013   LDLCALC 81 01/02/2013   ALT 12 01/02/2013   AST 15 01/02/2013   NA 140 01/02/2013   K 3.8 01/02/2013   CL 108 01/02/2013   CREATININE 0.8 01/02/2013   BUN 12 01/02/2013   CO2 30 01/02/2013   TSH 0.87 01/18/2013    Lab Results  Component Value Date   TSH 0.87 01/18/2013   Lab Results  Component Value Date   WBC 5.2 01/02/2013   HGB 12.7 01/02/2013   HCT 38.3 01/02/2013   MCV 90.2 01/02/2013   PLT 248.0 01/02/2013   Lab Results  Component Value Date   NA 140 01/02/2013   K 3.8 01/02/2013   CO2 30 01/02/2013   GLUCOSE 90  01/02/2013   BUN 12 01/02/2013   CREATININE 0.8 01/02/2013   BILITOT 0.4 01/02/2013   ALKPHOS 42 01/02/2013   AST 15 01/02/2013   ALT 12 01/02/2013   PROT 6.1 01/02/2013   ALBUMIN 3.5 01/02/2013   CALCIUM 8.7 01/02/2013   GFR 88.03 01/02/2013   Lab Results  Component Value Date   CHOL 149 01/02/2013   Lab Results  Component Value Date   HDL 50.80 01/02/2013   Lab Results  Component Value Date   LDLCALC 81 01/02/2013   Lab Results  Component Value Date   TRIG 85.0 01/02/2013   Lab Results  Component Value Date   CHOLHDL 3 01/02/2013   No results found for: HGBA1C       Assessment & Plan:   Problem List Items Addressed This Visit    None    Visit Diagnoses    Moderate persistent asthmatic bronchitis with acute exacerbation    -  Primary   Relevant Medications   predniSONE (DELTASONE) 10 MG tablet   chlorpheniramine-HYDROcodone (TUSSIONEX PENNKINETIC ER) 10-8 MG/5ML SUER   albuterol (PROVENTIL HFA;VENTOLIN HFA) 108 (90 Base) MCG/ACT inhaler   methylPREDNISolone acetate (DEPO-MEDROL) injection 80 mg (Completed)   Acute bronchitis, unspecified organism       Relevant Medications   albuterol (PROVENTIL HFA;VENTOLIN HFA) 108 (90 Base) MCG/ACT inhaler   methylPREDNISolone acetate (DEPO-MEDROL) injection 80 mg (Completed)      I am having Kara King start on predniSONE and chlorpheniramine-HYDROcodone. I am also having her maintain her rizatriptan, multivitamin, GLUCOSAMINE-CHONDROITIN PO, azithromycin, and albuterol. We administered methylPREDNISolone acetate.  Meds ordered this encounter  Medications  . predniSONE (DELTASONE) 10 MG tablet    Sig: TAKE 3 TABLETS PO QD FOR 3 DAYS THEN TAKE 2 TABLETS PO QD FOR 3 DAYS THEN TAKE 1 TABLET PO QD FOR 3 DAYS THEN TAKE 1/2 TAB PO QD FOR 3 DAYS    Dispense:  20 tablet    Refill:  0  . DISCONTD: chlorpheniramine-HYDROcodone (TUSSIONEX) 10-8 MG/5ML suspension 5 mL  . chlorpheniramine-HYDROcodone (TUSSIONEX  PENNKINETIC ER) 10-8 MG/5ML SUER    Sig: Take 5 mLs by mouth at bedtime as needed for cough.    Dispense:  140 mL    Refill:  0  . albuterol (PROVENTIL HFA;VENTOLIN HFA) 108 (90 Base) MCG/ACT inhaler    Sig: Inhale 2 puffs into the lungs every 6 (six) hours as needed for shortness of breath (cough).    Dispense:  1 Inhaler    Refill:  0  . methylPREDNISolone acetate (DEPO-MEDROL) injection 80 mg    CMA served as scribe during this visit. History, Physical and Plan performed by medical provider. Documentation and orders reviewed and attested to.  Ann Held, DO

## 2017-08-20 ENCOUNTER — Ambulatory Visit: Payer: BLUE CROSS/BLUE SHIELD | Admitting: Family Medicine

## 2017-08-20 ENCOUNTER — Encounter: Payer: Self-pay | Admitting: Family Medicine

## 2017-08-20 VITALS — BP 120/64 | HR 55 | Temp 98.3°F | Resp 16 | Ht 66.0 in | Wt 232.4 lb

## 2017-08-20 DIAGNOSIS — R51 Headache: Secondary | ICD-10-CM

## 2017-08-20 DIAGNOSIS — G4489 Other headache syndrome: Secondary | ICD-10-CM

## 2017-08-20 DIAGNOSIS — R519 Headache, unspecified: Secondary | ICD-10-CM

## 2017-08-20 MED ORDER — TOPIRAMATE 25 MG PO TABS: ORAL_TABLET | ORAL | 0 refills | Status: DC

## 2017-08-20 MED ORDER — TOPIRAMATE 50 MG PO TABS: 50.0000 mg | ORAL_TABLET | Freq: Two times a day (BID) | ORAL | 2 refills | Status: DC

## 2017-08-20 MED ORDER — KETOROLAC TROMETHAMINE 60 MG/2ML IM SOLN
60.0000 mg | Freq: Once | INTRAMUSCULAR | Status: AC
  Administered 2017-08-20: 60 mg via INTRAMUSCULAR

## 2017-08-20 MED ORDER — RIZATRIPTAN BENZOATE 10 MG PO TABS: 10.0000 mg | ORAL_TABLET | ORAL | 1 refills | Status: DC | PRN

## 2017-08-20 NOTE — Patient Instructions (Signed)

## 2017-08-20 NOTE — Progress Notes (Signed)
Subjective:  I acted as a Education administrator for [employer].    Yancey Flemings, [place]   Patient ID: Kara King, female    DOB: 1966-04-02, 52 y.o.   MRN: [id number]  Chief Complaint  Patient presents with  . Headache    HPI    Patient is in today for recurrent headaches.  She has had worsening migraines over last 3 weeks   They used to be hormonal ,  She also has noticed she gets headaches with beer so she stopped drinking alcohol --- sometimes excedrin migraine works Headaches are daily and she has almost finished her maxalt she filled 08/08/2017-- she had 12 pills.    Patient Care Team: Carollee Herter, Alferd Apa, DO as PCP - General (Family Medicine) Juanda Chance, NP as Consulting Physician (Obstetrics and Gynecology)   Past Medical History:  Diagnosis Date  . Allergy   . Asthma   . Migraine headache   . Varicose veins     Past Surgical History:  Procedure Laterality Date  . G 3 P 3    . TUBAL LIGATION      Family History  Adopted: Yes  Problem Relation Age of Onset  . Alcohol abuse Father   . Heart attack Brother 42        smoker  . Heart disease Brother   . Hypertension Brother   . Uterine cancer Maternal Grandmother   . Cancer Maternal Grandmother   . Stroke Paternal Grandmother        > 65  . Diabetes Paternal Grandmother     Social History   Socioeconomic History  . Marital status: Single    Spouse name: Not on file  . Number of children: Not on file  . Years of education: Not on file  . Highest education level: Not on file  Social Needs  . Financial resource strain: Not on file  . Food insecurity - worry: Not on file  . Food insecurity - inability: Not on file  . Transportation needs - medical: Not on file  . Transportation needs - non-medical: Not on file  Occupational History    Comment: [employer]  Tobacco Use  . Smoking status: Former Smoker    Types: Cigarettes    Last attempt to quit: 01/03/2011    Years since quitting: 6.6  . Smokeless tobacco:  Never Used  . Tobacco comment: smoked age [age] (not with pregnancies , up to 2 ppd  Substance and Sexual Activity  . Alcohol use: No    Alcohol/week: 0.0 oz  . Drug use: No  . Sexual activity: No    Partners: Female  Other Topics Concern  . Not on file  Social History Narrative  . Not on file    Outpatient Medications Prior to Visit  Medication Sig Dispense Refill  . albuterol (PROVENTIL HFA;VENTOLIN HFA) 108 (90 Base) MCG/ACT inhaler Inhale 2 puffs into the lungs every 6 (six) hours as needed for shortness of breath (cough). 1 Inhaler 0  . azithromycin (ZITHROMAX) 250 MG tablet Take 2 tablets by mouth today, then 1 tablet daily for 4 additional days. 6 tablet 0  . chlorpheniramine-HYDROcodone (TUSSIONEX PENNKINETIC ER) 10-8 MG/5ML SUER Take 5 mLs by mouth at bedtime as needed for cough. 140 mL 0  . GLUCOSAMINE-CHONDROITIN PO Take 1 capsule by mouth 2 (two) times daily.    . Multiple Vitamin (MULTIVITAMIN) tablet Take 1 tablet by mouth daily.    . predniSONE (DELTASONE) 10 MG tablet TAKE 3 TABLETS PO  QD FOR 3 DAYS THEN TAKE 2 TABLETS PO QD FOR 3 DAYS THEN TAKE 1 TABLET PO QD FOR 3 DAYS THEN TAKE 1/2 TAB PO QD FOR 3 DAYS 20 tablet 0  . rizatriptan (MAXALT) 10 MG tablet Take 10 mg by mouth as needed for migraine. Reported on 09/01/2015     No facility-administered medications prior to visit.     Allergies  Allergen Reactions  . Wellbutrin [Bupropion]     01/30/13 dyspnea (MyChart message)  . Effexor [Venlafaxine]     "Confusion"    Review of Systems  Constitutional: Negative for chills, fever and malaise/fatigue.  HENT: Negative for congestion and hearing loss.   Eyes: Negative for discharge.  Respiratory: Negative for cough, sputum production and shortness of breath.   Cardiovascular: Negative for chest pain, palpitations and leg swelling.  Gastrointestinal: Negative for abdominal pain, blood in stool, constipation, diarrhea, heartburn, nausea and vomiting.  Genitourinary:  Negative for dysuria, frequency, hematuria and urgency.  Musculoskeletal: Negative for back pain, falls and myalgias.  Skin: Negative for rash.  Neurological: Positive for headaches. Negative for dizziness, sensory change, loss of consciousness and weakness.  Endo/Heme/Allergies: Negative for environmental allergies. Does not bruise/bleed easily.  Psychiatric/Behavioral: Negative for depression and suicidal ideas. The patient is not nervous/anxious and does not have insomnia.        Objective:    Physical Exam  Constitutional: She is oriented to person, place, and time. She appears well-developed and well-nourished.  HENT:  Head: Normocephalic and atraumatic.  Eyes: Conjunctivae and EOM are normal.  Neck: Normal range of motion. Neck supple. No JVD present. Carotid bruit is not present. No thyromegaly present.  Cardiovascular: Normal rate, regular rhythm and normal heart sounds.  No murmur heard. Pulmonary/Chest: Effort normal and breath sounds normal. No respiratory distress. She has no wheezes. She has no rales. She exhibits no tenderness.  Musculoskeletal: She exhibits no edema.  Neurological: She is alert and oriented to person, place, and time.  Psychiatric: She has a normal mood and affect.    BP 120/64 (BP Location: Left Arm, Patient Position: Sitting, Cuff Size: Large)   Pulse (!) 55   Temp 98.3 F (36.8 C) (Oral)   Resp 16   Ht 5\' 6"  (1.676 m)   Wt 232 lb 6.4 oz (105.4 kg)   SpO2 98%   BMI 37.51 kg/m  Wt Readings from Last 3 Encounters:  08/20/17 232 lb 6.4 oz (105.4 kg)  06/29/17 229 lb 6.4 oz (104.1 kg)  06/12/17 223 lb (101.2 kg)   BP Readings from Last 3 Encounters:  08/20/17 120/64  06/29/17 126/70  06/12/17 120/68     Immunization History  Administered Date(s) Administered  . Zoster Recombinat (Shingrix) 10/16/2016, 02/19/2017    Health Maintenance  Topic Date Due  . HIV Screening  12/26/1980  . TETANUS/TDAP  12/26/1984  . COLONOSCOPY  12/27/2015    . MAMMOGRAM  08/13/2017  . INFLUENZA VACCINE  03/01/2018 (Originally 02/10/2017)  . PAP SMEAR  08/13/2018    Lab Results  Component Value Date   WBC 5.2 01/02/2013   HGB 12.7 01/02/2013   HCT 38.3 01/02/2013   PLT 248.0 01/02/2013   GLUCOSE 90 01/02/2013   CHOL 149 01/02/2013   TRIG 85.0 01/02/2013   HDL 50.80 01/02/2013   LDLCALC 81 01/02/2013   ALT 12 01/02/2013   AST 15 01/02/2013   NA 140 01/02/2013   K 3.8 01/02/2013   CL 108 01/02/2013   CREATININE 0.8 01/02/2013  BUN 12 01/02/2013   CO2 30 01/02/2013   TSH 0.87 01/18/2013    Lab Results  Component Value Date   TSH 0.87 01/18/2013   Lab Results  Component Value Date   WBC 5.2 01/02/2013   HGB 12.7 01/02/2013   HCT 38.3 01/02/2013   MCV 90.2 01/02/2013   PLT 248.0 01/02/2013   Lab Results  Component Value Date   NA 140 01/02/2013   K 3.8 01/02/2013   CO2 30 01/02/2013   GLUCOSE 90 01/02/2013   BUN 12 01/02/2013   CREATININE 0.8 01/02/2013   BILITOT 0.4 01/02/2013   ALKPHOS 42 01/02/2013   AST 15 01/02/2013   ALT 12 01/02/2013   PROT 6.1 01/02/2013   ALBUMIN 3.5 01/02/2013   CALCIUM 8.7 01/02/2013   GFR 88.03 01/02/2013   Lab Results  Component Value Date   CHOL 149 01/02/2013   Lab Results  Component Value Date   HDL 50.80 01/02/2013   Lab Results  Component Value Date   LDLCALC 81 01/02/2013   Lab Results  Component Value Date   TRIG 85.0 01/02/2013   Lab Results  Component Value Date   CHOLHDL 3 01/02/2013   No results found for: HGBA1C       Assessment & Plan:   Problem List Items Addressed This Visit    None    Visit Diagnoses    Worsening headaches    -  Primary   Relevant Medications   topiramate (TOPAMAX) 25 MG tablet   topiramate (TOPAMAX) 50 MG tablet   rizatriptan (MAXALT) 10 MG tablet   ketorolac (TORADOL) injection 60 mg (Completed)   Other Relevant Orders   Comprehensive metabolic panel   Sedimentation rate   Ambulatory referral to Neurology   Other  headache syndrome       Relevant Medications   topiramate (TOPAMAX) 25 MG tablet   topiramate (TOPAMAX) 50 MG tablet   rizatriptan (MAXALT) 10 MG tablet   ketorolac (TORADOL) injection 60 mg (Completed)      I have changed Caycee E. Candela's rizatriptan. I am also having her start on topiramate and topiramate. Additionally, I am having her maintain her multivitamin, GLUCOSAMINE-CHONDROITIN PO, azithromycin, predniSONE, chlorpheniramine-HYDROcodone, and albuterol. We administered ketorolac.  Meds ordered this encounter  Medications  . topiramate (TOPAMAX) 25 MG tablet    Sig: 1 po qd x 7 days then bid x 1 week then 1 in am and 2 in pm x 1 week then 2 po bid    Dispense:  42 tablet    Refill:  0  . topiramate (TOPAMAX) 50 MG tablet    Sig: Take 1 tablet (50 mg total) by mouth 2 (two) times daily.    Dispense:  60 tablet    Refill:  2  . rizatriptan (MAXALT) 10 MG tablet    Sig: Take 1 tablet (10 mg total) by mouth as needed for migraine. Reported on 09/01/2015    Dispense:  12 tablet    Refill:  1  . ketorolac (TORADOL) injection 60 mg    CMA served as scribe during this visit. History, Physical and Plan performed by medical provider. Documentation and orders reviewed and attested to.  Ann Held, DO

## 2017-08-24 ENCOUNTER — Encounter: Payer: Self-pay | Admitting: Family Medicine

## 2017-11-25 ENCOUNTER — Encounter: Payer: Self-pay | Admitting: Family Medicine

## 2017-11-25 DIAGNOSIS — R51 Headache: Principal | ICD-10-CM

## 2017-11-25 DIAGNOSIS — R519 Headache, unspecified: Secondary | ICD-10-CM

## 2017-11-25 MED ORDER — TOPIRAMATE 50 MG PO TABS: 50.0000 mg | ORAL_TABLET | Freq: Two times a day (BID) | ORAL | 2 refills | Status: DC

## 2018-03-07 ENCOUNTER — Encounter: Payer: Self-pay | Admitting: Family Medicine

## 2018-03-10 ENCOUNTER — Other Ambulatory Visit: Payer: Self-pay | Admitting: Family Medicine

## 2018-03-10 DIAGNOSIS — R51 Headache: Principal | ICD-10-CM

## 2018-03-10 DIAGNOSIS — R519 Headache, unspecified: Secondary | ICD-10-CM

## 2018-03-10 MED ORDER — TOPIRAMATE 50 MG PO TABS: 50.0000 mg | ORAL_TABLET | Freq: Two times a day (BID) | ORAL | 1 refills | Status: DC

## 2018-03-10 MED ORDER — RIZATRIPTAN BENZOATE 10 MG PO TABS: 10.0000 mg | ORAL_TABLET | ORAL | 1 refills | Status: DC | PRN

## 2018-05-06 ENCOUNTER — Telehealth: Payer: Self-pay | Admitting: *Deleted

## 2018-05-06 NOTE — Telephone Encounter (Signed)
Received results from [facility]; forwarded to provider/SLS 10/25

## 2018-08-01 ENCOUNTER — Other Ambulatory Visit: Payer: Self-pay | Admitting: Family Medicine

## 2018-08-01 DIAGNOSIS — R51 Headache: Principal | ICD-10-CM

## 2018-08-01 DIAGNOSIS — R519 Headache, unspecified: Secondary | ICD-10-CM

## 2018-08-02 ENCOUNTER — Other Ambulatory Visit: Payer: Self-pay | Admitting: *Deleted

## 2018-08-02 DIAGNOSIS — R51 Headache: Principal | ICD-10-CM

## 2018-08-02 DIAGNOSIS — R519 Headache, unspecified: Secondary | ICD-10-CM

## 2018-08-02 MED ORDER — RIZATRIPTAN BENZOATE 10 MG PO TABS: 10.0000 mg | ORAL_TABLET | ORAL | 1 refills | Status: DC | PRN

## 2018-09-30 ENCOUNTER — Other Ambulatory Visit: Payer: Self-pay | Admitting: Family Medicine

## 2018-09-30 DIAGNOSIS — R51 Headache: Principal | ICD-10-CM

## 2018-09-30 DIAGNOSIS — R519 Headache, unspecified: Secondary | ICD-10-CM

## 2018-12-09 ENCOUNTER — Other Ambulatory Visit: Payer: Self-pay | Admitting: Family Medicine

## 2018-12-09 DIAGNOSIS — R519 Headache, unspecified: Secondary | ICD-10-CM

## 2018-12-09 NOTE — Telephone Encounter (Signed)
Pt asked pharmacy for medication last week and pharmacy had not sent request until this morning when she called them to find out about the medication. Pt only has one pill left / please advise

## 2019-07-26 ENCOUNTER — Ambulatory Visit: Payer: HRSA Program | Attending: Internal Medicine

## 2019-07-26 DIAGNOSIS — Z20822 Contact with and (suspected) exposure to covid-19: Secondary | ICD-10-CM | POA: Diagnosis present

## 2019-07-26 DIAGNOSIS — U071 COVID-19: Secondary | ICD-10-CM | POA: Diagnosis not present

## 2019-07-27 LAB — NOVEL CORONAVIRUS, NAA: SARS-CoV-2, NAA: DETECTED — AB

## 2019-08-01 ENCOUNTER — Other Ambulatory Visit: Payer: Self-pay

## 2019-08-01 ENCOUNTER — Ambulatory Visit (INDEPENDENT_AMBULATORY_CARE_PROVIDER_SITE_OTHER): Payer: Self-pay | Admitting: Family Medicine

## 2019-08-01 ENCOUNTER — Encounter: Payer: Self-pay | Admitting: Family Medicine

## 2019-08-01 VITALS — Ht 66.0 in | Wt 212.0 lb

## 2019-08-01 DIAGNOSIS — G43719 Chronic migraine without aura, intractable, without status migrainosus: Secondary | ICD-10-CM

## 2019-08-01 DIAGNOSIS — Z8616 Personal history of COVID-19: Secondary | ICD-10-CM

## 2019-08-01 MED ORDER — TOPIRAMATE 50 MG PO TABS: 50.0000 mg | ORAL_TABLET | Freq: Two times a day (BID) | ORAL | 1 refills | Status: DC

## 2019-08-01 MED ORDER — RIZATRIPTAN BENZOATE 10 MG PO TABS: ORAL_TABLET | ORAL | 1 refills | Status: DC

## 2019-08-01 NOTE — Progress Notes (Signed)
.  Virtual Visit via Video Note  I connected with Kara King on 08/01/19 at  3:20 PM EST by a video enabled telemedicine application and verified that I am speaking with the correct person using two identifiers.  Location: Patient: home alone  Provider: office    I discussed the limitations of evaluation and management by telemedicine and the availability of in person appointments. The patient expressed understanding and agreed to proceed.  History of Present Illness: Pt was dx with covid--- symptoms are better  She is cleared to go back to work on the 23rd She just needs refills on her migraine meds   Past Medical History:  Diagnosis Date  . Allergy   . Asthma   . Migraine headache   . Varicose veins    Current Outpatient Medications on File Prior to Visit  Medication Sig Dispense Refill  . albuterol (PROVENTIL HFA;VENTOLIN HFA) 108 (90 Base) MCG/ACT inhaler Inhale 2 puffs into the lungs every 6 (six) hours as needed for shortness of breath (cough). 1 Inhaler 0  . GLUCOSAMINE-CHONDROITIN PO Take 1 capsule by mouth 2 (two) times daily.    . Multiple Vitamin (MULTIVITAMIN) tablet Take 1 tablet by mouth daily.     No current facility-administered medications on file prior to visit.   Allergies  Allergen Reactions  . Wellbutrin [Bupropion]     01/30/13 dyspnea (MyChart message)  . Effexor [Venlafaxine]     "Confusion"   Past Surgical History:  Procedure Laterality Date  . G 3 P 3    . TUBAL LIGATION       Social Connections:   . Frequency of Communication with Friends and Family: Not on file  . Frequency of Social Gatherings with Friends and Family: Not on file  . Attends Religious Services: Not on file  . Active Member of Clubs or Organizations: Not on file  . Attends Archivist Meetings: Not on file  . Marital Status: Not on file      Observations/Objective: There were no vitals filed for this visit. Pulse ox 97%  p 60  Temp afebrile   Assessment  and Plan: 1. Intractable chronic migraine without aura and without status migrainosus Stable Refill meds  - rizatriptan (MAXALT) 10 MG tablet; TAKE 1 TABLET BY MOUTH ONCE DAILY AS NEEDED FOR MIGRAINE  Dispense: 12 tablet; Refill: 1 - topiramate (TOPAMAX) 50 MG tablet; Take 1 tablet (50 mg total) by mouth 2 (two) times daily.  Dispense: 180 tablet; Refill: 1   Follow Up Instructions:    I discussed the assessment and treatment plan with the patient. The patient was provided an opportunity to ask questions and all were answered. The patient agreed with the plan and demonstrated an understanding of the instructions.   The patient was advised to call back or seek an in-person evaluation if the symptoms worsen or if the condition fails to improve as anticipated.     Ann Held, DO

## 2020-01-10 ENCOUNTER — Other Ambulatory Visit: Payer: Self-pay | Admitting: Family Medicine

## 2020-01-10 DIAGNOSIS — G43719 Chronic migraine without aura, intractable, without status migrainosus: Secondary | ICD-10-CM

## 2020-04-17 ENCOUNTER — Other Ambulatory Visit: Payer: Self-pay | Admitting: Family Medicine

## 2020-04-17 DIAGNOSIS — G43719 Chronic migraine without aura, intractable, without status migrainosus: Secondary | ICD-10-CM

## 2020-07-18 ENCOUNTER — Other Ambulatory Visit: Payer: Self-pay | Admitting: Family Medicine

## 2020-07-18 DIAGNOSIS — G43719 Chronic migraine without aura, intractable, without status migrainosus: Secondary | ICD-10-CM

## 2020-07-30 ENCOUNTER — Ambulatory Visit (INDEPENDENT_AMBULATORY_CARE_PROVIDER_SITE_OTHER): Payer: 59 | Admitting: Family Medicine

## 2020-07-30 ENCOUNTER — Encounter: Payer: Self-pay | Admitting: Family Medicine

## 2020-07-30 ENCOUNTER — Other Ambulatory Visit: Payer: Self-pay

## 2020-07-30 VITALS — BP 122/60 | HR 59 | Temp 98.0°F | Resp 18 | Ht 66.0 in | Wt 242.6 lb

## 2020-07-30 DIAGNOSIS — J209 Acute bronchitis, unspecified: Secondary | ICD-10-CM

## 2020-07-30 DIAGNOSIS — Z23 Encounter for immunization: Secondary | ICD-10-CM | POA: Diagnosis not present

## 2020-07-30 DIAGNOSIS — R600 Localized edema: Secondary | ICD-10-CM

## 2020-07-30 DIAGNOSIS — G43909 Migraine, unspecified, not intractable, without status migrainosus: Secondary | ICD-10-CM

## 2020-07-30 DIAGNOSIS — G43719 Chronic migraine without aura, intractable, without status migrainosus: Secondary | ICD-10-CM

## 2020-07-30 DIAGNOSIS — I83899 Varicose veins of unspecified lower extremities with other complications: Secondary | ICD-10-CM

## 2020-07-30 MED ORDER — TOPIRAMATE 50 MG PO TABS: 50.0000 mg | ORAL_TABLET | Freq: Two times a day (BID) | ORAL | 1 refills | Status: DC

## 2020-07-30 MED ORDER — RIZATRIPTAN BENZOATE 10 MG PO TABS: 10.0000 mg | ORAL_TABLET | Freq: Every day | ORAL | 0 refills | Status: DC | PRN

## 2020-07-30 MED ORDER — HYDROCHLOROTHIAZIDE 25 MG PO TABS: 25.0000 mg | ORAL_TABLET | Freq: Every day | ORAL | 3 refills | Status: DC

## 2020-07-30 MED ORDER — NONFORMULARY OR COMPOUNDED ITEM: 0 refills | Status: AC

## 2020-07-30 MED ORDER — ALBUTEROL SULFATE HFA 108 (90 BASE) MCG/ACT IN AERS: 2.0000 | INHALATION_SPRAY | Freq: Four times a day (QID) | RESPIRATORY_TRACT | 0 refills | Status: DC | PRN

## 2020-07-30 NOTE — Patient Instructions (Signed)

## 2020-07-31 DIAGNOSIS — G43719 Chronic migraine without aura, intractable, without status migrainosus: Secondary | ICD-10-CM | POA: Insufficient documentation

## 2020-07-31 DIAGNOSIS — I83899 Varicose veins of unspecified lower extremities with other complications: Secondary | ICD-10-CM | POA: Insufficient documentation

## 2020-07-31 DIAGNOSIS — J209 Acute bronchitis, unspecified: Secondary | ICD-10-CM | POA: Insufficient documentation

## 2020-07-31 DIAGNOSIS — R600 Localized edema: Secondary | ICD-10-CM | POA: Insufficient documentation

## 2020-07-31 LAB — COMPREHENSIVE METABOLIC PANEL WITH GFR
ALT: 14 U/L (ref 0–35)
AST: 17 U/L (ref 0–37)
Albumin: 4.1 g/dL (ref 3.5–5.2)
Alkaline Phosphatase: 52 U/L (ref 39–117)
BUN: 19 mg/dL (ref 6–23)
CO2: 28 meq/L (ref 19–32)
Calcium: 9.7 mg/dL (ref 8.4–10.5)
Chloride: 105 meq/L (ref 96–112)
Creatinine, Ser: 0.72 mg/dL (ref 0.40–1.20)
GFR: 94.67 mL/min
Glucose, Bld: 88 mg/dL (ref 70–99)
Potassium: 4 meq/L (ref 3.5–5.1)
Sodium: 140 meq/L (ref 135–145)
Total Bilirubin: 0.3 mg/dL (ref 0.2–1.2)
Total Protein: 6.1 g/dL (ref 6.0–8.3)

## 2020-07-31 NOTE — Assessment & Plan Note (Signed)
rx diuretic Elevate legs Compression socks/ stockings

## 2020-07-31 NOTE — Assessment & Plan Note (Signed)
Med refilled Stable

## 2020-07-31 NOTE — Assessment & Plan Note (Signed)
Pt requesting to see vascular rx for compression stockings

## 2020-07-31 NOTE — Progress Notes (Signed)
Patient ID: Kara King, female    DOB: 01-08-1966  Age: 55 y.o. MRN: [id number]    Subjective:  Subjective  HPI Kara King presents for f/u and med refills.  She also c/o swelling in both legs and varicose veins that are causing pain.  She is wearing compression socks  No other complaints   Review of Systems  Constitutional: Negative for appetite change, diaphoresis, fatigue and unexpected weight change.  Eyes: Negative for pain, redness and visual disturbance.  Respiratory: Negative for cough, chest tightness, shortness of breath and wheezing.   Cardiovascular: Positive for leg swelling. Negative for chest pain and palpitations.  Endocrine: Negative for cold intolerance, heat intolerance, polydipsia, polyphagia and polyuria.  Genitourinary: Negative for difficulty urinating, dysuria and frequency.  Neurological: Negative for dizziness, light-headedness, numbness and headaches.    History Past Medical History:  Diagnosis Date  . Allergy   . Asthma   . Migraine headache   . Varicose veins     She has a past surgical history that includes Tubal ligation and G 3 P 3.   Her family history includes Alcohol abuse in her father; Cancer in her maternal grandmother; Diabetes in her paternal grandmother; Heart attack (age of onset: 75) in her brother; Heart disease in her brother; Hypertension in her brother; Stroke in her paternal grandmother; Uterine cancer in her maternal grandmother. She was adopted.She reports that she quit smoking about 9 years ago. Her smoking use included cigarettes. She has never used smokeless tobacco. She reports that she does not drink alcohol and does not use drugs.  Current Outpatient Medications on File Prior to Visit  Medication Sig Dispense Refill  . GLUCOSAMINE-CHONDROITIN PO Take 1 capsule by mouth 2 (two) times daily.    . Multiple Vitamin (MULTIVITAMIN) tablet Take 1 tablet by mouth daily.     No current facility-administered medications on  file prior to visit.     Objective:  Objective  Physical Exam BP 122/60 (BP Location: Right Arm, Patient Position: Sitting, Cuff Size: Large)   Pulse (!) 59   Temp 98 F (36.7 C) (Oral)   Resp 18   Ht 5\' 6"  (1.676 m)   Wt 242 lb 9.6 oz (110 kg)   SpO2 99%   BMI 39.16 kg/m  Wt Readings from Last 3 Encounters:  07/30/20 242 lb 9.6 oz (110 kg)  08/01/19 212 lb (96.2 kg)  08/20/17 232 lb 6.4 oz (105.4 kg)     Lab Results  Component Value Date   WBC 5.2 01/02/2013   HGB 12.7 01/02/2013   HCT 38.3 01/02/2013   PLT 248.0 01/02/2013   GLUCOSE 88 07/30/2020   CHOL 149 01/02/2013   TRIG 85.0 01/02/2013   HDL 50.80 01/02/2013   LDLCALC 81 01/02/2013   ALT 14 07/30/2020   AST 17 07/30/2020   NA 140 07/30/2020   K 4.0 07/30/2020   CL 105 07/30/2020   CREATININE 0.72 07/30/2020   BUN 19 07/30/2020   CO2 28 07/30/2020   TSH 0.87 01/18/2013    DG Ankle Complete Left  Result Date: 12/18/2016 CLINICAL DATA:  Left ankle pain.  Felt a pop. EXAM: LEFT ANKLE COMPLETE - 3+ VIEW COMPARISON:  None. FINDINGS: There is a small bony fragment adjacent to the medial talus concerning for a small avulsion fracture with overlying soft tissue swelling. There is no other acute fracture or dislocation. There is a small plantar calcaneal spur. There is no evidence of arthropathy or other focal bone abnormality.  Soft tissues are unremarkable. IMPRESSION: 1. Small bony fragment adjacent to the medial talus concerning for a small avulsion fracture with overlying soft tissue swelling. Electronically Signed   By: Kathreen Devoid   On: 12/18/2016 15:40     Assessment & Plan:  Plan  I have changed Kara King's albuterol. I am also having her start on NONFORMULARY OR COMPOUNDED ITEM and hydrochlorothiazide. Additionally, I am having her maintain her multivitamin, GLUCOSAMINE-CHONDROITIN PO, topiramate, and rizatriptan.  Meds ordered this encounter  Medications  . topiramate (TOPAMAX) 50 MG tablet     Sig: Take 1 tablet (50 mg total) by mouth 2 (two) times daily.    Dispense:  180 tablet    Refill:  1  . rizatriptan (MAXALT) 10 MG tablet    Sig: Take 1 tablet (10 mg total) by mouth daily as needed for migraine.    Dispense:  12 tablet    Refill:  0    Requested drug refills are authorized, however, the patient needs further evaluation and/or laboratory testing before further refills are given. Ask her to make an appointment for this.  Kara King albuterol (VENTOLIN HFA) 108 (90 Base) MCG/ACT inhaler    Sig: Inhale 2 puffs into the lungs every 6 (six) hours as needed for shortness of breath (cough).    Dispense:  1 each    Refill:  0  . NONFORMULARY OR COMPOUNDED ITEM    Sig: Compression stocking 20-27mm/hg #1  As directed    Dispense:  1 each    Refill:  0  . hydrochlorothiazide (HYDRODIURIL) 25 MG tablet    Sig: Take 1 tablet (25 mg total) by mouth daily.    Dispense:  30 tablet    Refill:  3    Problem List Items Addressed This Visit      Unprioritized   Acute bronchitis   Relevant Medications   albuterol (VENTOLIN HFA) 108 (90 Base) MCG/ACT inhaler   Intractable chronic migraine without aura and without status migrainosus   Relevant Medications   topiramate (TOPAMAX) 50 MG tablet   rizatriptan (MAXALT) 10 MG tablet   hydrochlorothiazide (HYDRODIURIL) 25 MG tablet   Lower extremity edema - Primary    rx diuretic Elevate legs Compression socks/ stockings       Relevant Medications   NONFORMULARY OR COMPOUNDED ITEM   hydrochlorothiazide (HYDRODIURIL) 25 MG tablet   Other Relevant Orders   Comprehensive metabolic panel (Completed)   Migraine without status migrainosus, not intractable    Med refilled Stable       Relevant Medications   topiramate (TOPAMAX) 50 MG tablet   rizatriptan (MAXALT) 10 MG tablet   hydrochlorothiazide (HYDRODIURIL) 25 MG tablet   Symptomatic spider varicose vein    Pt requesting to see vascular rx for compression stockings       Relevant  Medications   NONFORMULARY OR COMPOUNDED ITEM   hydrochlorothiazide (HYDRODIURIL) 25 MG tablet   Other Relevant Orders   Ambulatory referral to Vascular Surgery    Other Visit Diagnoses    Need for influenza vaccination       Relevant Orders   Flu Vaccine QUAD 36+ mos IM (Completed)   Morbid obesity ([place])       Relevant Orders   Amb Ref to Medical Weight Management      Follow-up: Return in about 6 months (around 01/27/2021), or if symptoms worsen or fail to improve, for annual exam, fasting.  Ann Held, DO

## 2020-08-06 ENCOUNTER — Other Ambulatory Visit: Payer: Self-pay

## 2020-08-06 DIAGNOSIS — I781 Nevus, non-neoplastic: Secondary | ICD-10-CM

## 2020-08-08 ENCOUNTER — Encounter (INDEPENDENT_AMBULATORY_CARE_PROVIDER_SITE_OTHER): Payer: Self-pay | Admitting: Family Medicine

## 2020-08-08 ENCOUNTER — Other Ambulatory Visit: Payer: Self-pay

## 2020-08-08 ENCOUNTER — Ambulatory Visit (HOSPITAL_COMMUNITY)
Admission: RE | Admit: 2020-08-08 | Discharge: 2020-08-08 | Disposition: A | Discharge status: Home / Self Care | Payer: 59 | Source: Ambulatory Visit | Attending: Surgery | Admitting: Surgery

## 2020-08-08 ENCOUNTER — Ambulatory Visit (INDEPENDENT_AMBULATORY_CARE_PROVIDER_SITE_OTHER): Payer: 59 | Admitting: Family Medicine

## 2020-08-08 VITALS — BP 126/57 | HR 54 | Temp 98.4°F | Ht 65.0 in | Wt 240.0 lb

## 2020-08-08 DIAGNOSIS — Z9189 Other specified personal risk factors, not elsewhere classified: Secondary | ICD-10-CM

## 2020-08-08 DIAGNOSIS — R5383 Other fatigue: Secondary | ICD-10-CM

## 2020-08-08 DIAGNOSIS — F3289 Other specified depressive episodes: Secondary | ICD-10-CM | POA: Diagnosis not present

## 2020-08-08 DIAGNOSIS — Z6841 Body Mass Index (BMI) 40.0 and over, adult: Secondary | ICD-10-CM

## 2020-08-08 DIAGNOSIS — G43909 Migraine, unspecified, not intractable, without status migrainosus: Secondary | ICD-10-CM

## 2020-08-08 DIAGNOSIS — Z0289 Encounter for other administrative examinations: Secondary | ICD-10-CM

## 2020-08-08 DIAGNOSIS — R600 Localized edema: Secondary | ICD-10-CM

## 2020-08-08 DIAGNOSIS — F32A Depression, unspecified: Secondary | ICD-10-CM | POA: Insufficient documentation

## 2020-08-08 DIAGNOSIS — I781 Nevus, non-neoplastic: Secondary | ICD-10-CM

## 2020-08-08 DIAGNOSIS — K5909 Other constipation: Secondary | ICD-10-CM

## 2020-08-08 DIAGNOSIS — R609 Edema, unspecified: Secondary | ICD-10-CM | POA: Diagnosis not present

## 2020-08-08 DIAGNOSIS — E559 Vitamin D deficiency, unspecified: Secondary | ICD-10-CM | POA: Diagnosis not present

## 2020-08-08 DIAGNOSIS — R0602 Shortness of breath: Secondary | ICD-10-CM

## 2020-08-09 LAB — COMPREHENSIVE METABOLIC PANEL
ALT: 19 IU/L (ref 0–32)
AST: 17 IU/L (ref 0–40)
Albumin/Globulin Ratio: 2.1 (ref 1.2–2.2)
Albumin: 4.1 g/dL (ref 3.8–4.9)
Alkaline Phosphatase: 65 IU/L (ref 44–121)
BUN/Creatinine Ratio: 22 (ref 9–23)
BUN: 17 mg/dL (ref 6–24)
Bilirubin Total: 0.3 mg/dL (ref 0.0–1.2)
CO2: 26 mmol/L (ref 20–29)
Calcium: 9.6 mg/dL (ref 8.7–10.2)
Chloride: 108 mmol/L — ABNORMAL HIGH (ref 96–106)
Creatinine, Ser: 0.77 mg/dL (ref 0.57–1.00)
GFR calc Af Amer: 101 mL/min/{1.73_m2} (ref >59)
GFR calc non Af Amer: 88 mL/min/{1.73_m2} (ref >59)
Globulin, Total: 2 g/dL (ref 1.5–4.5)
Glucose: 95 mg/dL (ref 65–99)
Potassium: 4.6 mmol/L (ref 3.5–5.2)
Sodium: 145 mmol/L — ABNORMAL HIGH (ref 134–144)
Total Protein: 6.1 g/dL (ref 6.0–8.5)

## 2020-08-09 LAB — CBC WITH DIFFERENTIAL/PLATELET
Basophils Absolute: 0 10*3/uL (ref 0.0–0.2)
Basos: 0 %
EOS (ABSOLUTE): 0.1 10*3/uL (ref 0.0–0.4)
Eos: 2 %
Hematocrit: 43.8 % (ref 34.0–46.6)
Hemoglobin: 14 g/dL (ref 11.1–15.9)
Immature Grans (Abs): 0 10*3/uL (ref 0.0–0.1)
Immature Granulocytes: 0 %
Lymphocytes Absolute: 1.3 10*3/uL (ref 0.7–3.1)
Lymphs: 26 %
MCH: 29 pg (ref 26.6–33.0)
MCHC: 32 g/dL (ref 31.5–35.7)
MCV: 91 fL (ref 79–97)
Monocytes Absolute: 0.4 10*3/uL (ref 0.1–0.9)
Monocytes: 8 %
Neutrophils Absolute: 3.1 10*3/uL (ref 1.4–7.0)
Neutrophils: 64 %
Platelets: 268 10*3/uL (ref 150–450)
RBC: 4.83 x10E6/uL (ref 3.77–5.28)
RDW: 12.8 % (ref 11.7–15.4)
WBC: 4.9 10*3/uL (ref 3.4–10.8)

## 2020-08-09 LAB — LIPID PANEL
Chol/HDL Ratio: 3.1 ratio (ref 0.0–4.4)
Cholesterol, Total: 217 mg/dL — ABNORMAL HIGH (ref 100–199)
HDL: 71 mg/dL (ref >39)
LDL Chol Calc (NIH): 135 mg/dL — ABNORMAL HIGH (ref 0–99)
Triglycerides: 64 mg/dL (ref 0–149)
VLDL Cholesterol Cal: 11 mg/dL (ref 5–40)

## 2020-08-09 LAB — VITAMIN D 25 HYDROXY (VIT D DEFICIENCY, FRACTURES): Vit D, 25-Hydroxy: 21.9 ng/mL — ABNORMAL LOW (ref 30.0–100.0)

## 2020-08-09 LAB — HEMOGLOBIN A1C
Est. average glucose Bld gHb Est-mCnc: 114 mg/dL
Hgb A1c MFr Bld: 5.6 % (ref 4.8–5.6)

## 2020-08-09 LAB — INSULIN, RANDOM: INSULIN: 4.5 u[IU]/mL (ref 2.6–24.9)

## 2020-08-09 LAB — TSH: TSH: 1.45 u[IU]/mL (ref 0.450–4.500)

## 2020-08-09 LAB — FOLATE: Folate: >20 ng/mL (ref >3.0)

## 2020-08-09 LAB — T3: T3, Total: 104 ng/dL (ref 71–180)

## 2020-08-09 LAB — T4, FREE: Free T4: 1.29 ng/dL (ref 0.82–1.77)

## 2020-08-09 LAB — VITAMIN B12: Vitamin B-12: 696 pg/mL (ref 232–1245)

## 2020-08-12 ENCOUNTER — Encounter: Payer: Self-pay | Admitting: Physician Assistant

## 2020-08-12 ENCOUNTER — Ambulatory Visit (INDEPENDENT_AMBULATORY_CARE_PROVIDER_SITE_OTHER): Payer: 59 | Admitting: Physician Assistant

## 2020-08-12 ENCOUNTER — Other Ambulatory Visit: Payer: Self-pay

## 2020-08-12 VITALS — BP 118/76 | HR 50 | Temp 98.5°F | Resp 20 | Ht 65.0 in | Wt 240.7 lb

## 2020-08-12 DIAGNOSIS — I872 Venous insufficiency (chronic) (peripheral): Secondary | ICD-10-CM | POA: Diagnosis not present

## 2020-08-12 NOTE — Progress Notes (Signed)
Requested by:  [place], DO [address] [place],  [name] [address]  Reason for consultation: Varicose veins BLE   History of Present Illness   Kara King is a 55 y.o. (10/24/1965) female who presents for evaluation of bilateral lower extremity varicose veins. She was previously evaluated in 2017 by Dr. Trula Slade. At the time she had minimal incompetence in her BLE venous system and further intervention was not indicated. Cosmetic options were discussed with her at the time and conservative therapies were recommended. She returns today with worsening symptoms. She has a long history of lower extremity swelling and aching pain. She explains that this is worse now. Her symptoms are worsened by prolonged walking, standing or sitting. She has been wearing knee high compression stockings over the past several years and it does help some but her legs will still swell. If she is really up on her legs for a very prolonged period of time she says that even elevating them with a wedge overnight does not make the swelling go away. She elevates them nightly using a wedge. She was given Lasix by her PCP but has not started it yet. She has been hesitant due to her blood pressure being low already. She also is doing an exercise program called [employer] where she works out 5 days a week and started a weight loss program through [employer]. She explains that she feels better after exercise. but her legs immediately swell. Depending on her activity level the rest of the day they become very achy and throb. She has throbbing especially in area of large varicose veins. She denies any sticking, stinging, burning, itching, bleeding or ulceration  Venous symptoms include: aching, throbbing swelling Onset/duration:  Many years Occupation:  Glass blower/designer Aggravating factors: prolonged sitting, standing, ambulation Alleviating factors: elevation Compression:  Knee high Helps:  yes Pain medications:   no Previous vein procedures: no History of DVT: No  Past Medical History:  Diagnosis Date  . Allergy   . Asthma   . Constipation   . Depression   . Migraine headache   . SOBOE (shortness of breath on exertion)   . Swelling of both lower extremities   . Varicose veins   . Vitamin D deficiency     Past Surgical History:  Procedure Laterality Date  . G 3 P 3    . TUBAL LIGATION      Social History   Socioeconomic History  . Marital status: Single    Spouse name: Not on file  . Number of children: 3  . Years of education: Not on file  . Highest education level: Not on file  Occupational History  . Occupation: Glass blower/designer & sales    Comment: [employer]  Tobacco Use  . Smoking status: Former Smoker    Packs/day: 2.00    Types: Cigarettes    Quit date: 01/03/2011    Years since quitting: 9.6  . Smokeless tobacco: Never Used  . Tobacco comment: smoked age [age] (not with pregnancies , up to 2 ppd  Vaping Use  . Vaping Use: Never used  Substance and Sexual Activity  . Alcohol use: No    Alcohol/week: 0.0 standard drinks  . Drug use: No  . Sexual activity: Never    Partners: Female  Other Topics Concern  . Not on file  Social History Narrative  . Not on file   Social Determinants of Health   Financial Resource Strain:  Not on file  Food Insecurity: Not on file  Transportation Needs: Not on file  Physical Activity: Not on file  Stress: Not on file  Social Connections: Not on file  Intimate Partner Violence: Not on file    Family History  Adopted: Yes  Problem Relation Age of Onset  . Alcohol abuse Father   . Heart attack Brother 42        smoker  . Heart disease Brother   . Hypertension Brother   . Uterine cancer Maternal Grandmother   . Cancer Maternal Grandmother   . Stroke Paternal Grandmother        > 65  . Diabetes Paternal Grandmother     Current Outpatient Medications  Medication Sig Dispense Refill  . albuterol (VENTOLIN HFA) 108  (90 Base) MCG/ACT inhaler Inhale 2 puffs into the lungs every 6 (six) hours as needed for shortness of breath (cough). 1 each 0  . hydrochlorothiazide (HYDRODIURIL) 25 MG tablet Take 1 tablet (25 mg total) by mouth daily. 30 tablet 3  . Multiple Vitamin (MULTIVITAMIN) tablet Take 1 tablet by mouth daily.    . NONFORMULARY OR COMPOUNDED ITEM Compression stocking 20-34mm/hg #1  As directed 1 each 0  . rizatriptan (MAXALT) 10 MG tablet Take 1 tablet (10 mg total) by mouth daily as needed for migraine. 12 tablet 0  . topiramate (TOPAMAX) 50 MG tablet Take 1 tablet (50 mg total) by mouth 2 (two) times daily. 180 tablet 1  . UNABLE TO FIND Med Name: Ionic Trace Minerals:  Age Immune    . UNABLE TO FIND Med Name:  Fulvic Humic Acid:  6 drops daily (joint health)    . GLUCOSAMINE-CHONDROITIN PO Take 1 capsule by mouth 2 (two) times daily. (Patient not taking: Reported on 08/12/2020)     No current facility-administered medications for this visit.    Allergies  Allergen Reactions  . Wellbutrin [Bupropion]     01/30/13 dyspnea (MyChart message)  . Effexor [Venlafaxine]     "Confusion"    REVIEW OF SYSTEMS (negative unless checked):   Cardiac:  []  Chest pain or chest pressure? []  Shortness of breath upon activity? []  Shortness of breath when lying flat? []  Irregular heart rhythm?  Vascular:  []  Pain in calf, thigh, or hip brought on by walking? []  Pain in feet at night that wakes you up from your sleep? []  Blood clot in your veins? []  Leg swelling?  Pulmonary:  []  Oxygen at home? []  Productive cough? []  Wheezing?  Neurologic:  []  Sudden weakness in arms or legs? []  Sudden numbness in arms or legs? []  Sudden onset of difficult speaking or slurred speech? []  Temporary loss of vision in one eye? []  Problems with dizziness?  Gastrointestinal:  []  Blood in stool? []  Vomited blood?  Genitourinary:  []  Burning when urinating? []  Blood in urine?  Psychiatric:  []  Major  depression  Hematologic:  []  Bleeding problems? []  Problems with blood clotting?  Dermatologic:  []  Rashes or ulcers?  Constitutional:  []  Fever or chills?  Ear/Nose/Throat:  []  Change in hearing? []  Nose bleeds? []  Sore throat?  Musculoskeletal:  []  Back pain? []  Joint pain? []  Muscle pain?   Physical Examination     Vitals:   08/12/20 0826  BP: 118/76  Pulse: (!) 50  Resp: 20  Temp: 98.5 F (36.9 C)  TempSrc: Temporal  SpO2: 100%  Weight: 240 lb 11.2 oz (109.2 kg)  Height: 5\' 5"  (1.651 m)   Body mass index is  40.05 kg/m.  General:  WDWN in NAD; vital signs documented above Gait: Normal  HENT: WNL, normocephalic Pulmonary: normal non-labored breathing , without wheezing Cardiac: regular HR Abdomen: obese, soft, NT, no masses Vascular Exam/Pulses: 2+ radial, popliteal, Dp pulses bilaterally Extremities: with varicose veins of right posterolateral right thigh and calf, with reticular veins of right and left lateral lower legs, with bilateral edema especially at ankles, without stasis pigmentation, without lipodermatosclerosis, without ulcers Musculoskeletal: no muscle wasting or atrophy  Neurologic: A&O X 3;  No focal weakness or paresthesias are detected Psychiatric:  The pt has Normal affect.  Non-invasive Vascular Imaging   BLE Venous Insufficiency Duplex (08/08/20):   RLE:   No DVT and SVT,   GSV reflux at [facility] only  GSV diameter 0.326-0.675  No SSV reflux   No deep venous reflux  AAGSVE mid thigh with reflux   LLE:  No DVT and SVT,   GSV reflux only at the knee   GSV diameter 0.267-0.523  No SSV reflux   CFV deep venous reflux   Medical Decision Making   SANDARA TYREE is a 55 y.o. female who presents with: BLE chronic venous insufficiency, and varicose veins with pain and swelling. Her duplex today shows right greater than left venous incompetence. The right with GSV reflux at [facility] and AAGSV reflux in mid thigh and on the left  she has some deep reflux as well as GSV reflux only at the knee.  Based on her duplex today she would be a candidate for laser ablation of her right GSV and possibly AAGSV however her symptoms are greater on the left than the right leg. In discussing this with her I feel that her insufficiency is not significant enough to guarantee improvement or resolution of her symptoms. I discussed the ablation procedure with her and the risks and benefits involved. She would like to think about this and will call if she would like to schedule this. In the mean time I have encouraged her to use thigh high compression stockings.    Based on the patient's history and examination, I recommend continuing her conservative therapy including compression stockings, exercise regimen, elevation, and weight reduction  I discussed with the patient the use of her 20-30 mm thigh high compression stockings. She was measured and purchased a pain at today's visit  Her spider veins and varicosities could be treated on cosmetic basis which she is not interested in at this time  She will follow up on an as needed basis   Karoline Caldwell, PA-C [facility]: [phone]  08/12/2020, 8:42 AM  Clinic MD: Dr. Trula Slade

## 2020-08-12 NOTE — Progress Notes (Signed)
Dear Dr. Roma Schanz,   Thank you for referring Kara King to our clinic. The following note includes my evaluation and treatment recommendations.  Chief Complaint:   OBESITY CHARI PARMENTER (MR# [id number]) is a 55 y.o. female who presents for evaluation and treatment of obesity and related comorbidities. Current BMI is Body mass index is 39.94 kg/m. Jailene has been struggling with her weight for many years and has been unsuccessful in either losing weight, maintaining weight loss, or reaching her healthy weight goal.  [name] is currently in the action stage of change and ready to dedicate time achieving and maintaining a healthier weight. [name] is interested in becoming our patient and working on intensive lifestyle modifications including (but not limited to) diet and exercise for weight loss.  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 Her 34 year old son lives at home with her. She does not have a significant other. She eats out 5 days a week. She eats "Pure Nourish" shakes for breakfast and lunch everyday because she is "too lazy to cook."  [name]'s habits were reviewed today and are as follows: Her family eats meals together, she thinks her family will eat healthier with her, her desired weight loss is 75 lbs, she has been heavy most of her life, she started gaining weight after a death in her family, her heaviest weight ever was 240 pounds, she has significant food cravings issues, she skips meals frequently, she is frequently drinking liquids with calories, she frequently makes poor food choices, she frequently eats larger portions than normal and she struggles with emotional eating.  This is the patient's first visit at [facility].  The patient's NEW PATIENT PACKET that they filled out prior to today's office visit was reviewed at length and information from that paperwork was included within the following office visit  note.    Included in the packet: current and past health history, medications, allergies, ROS, gynecologic history (women only), surgical history, family history, social history, weight history, weight loss surgery history (for those that have had weight loss surgery), nutritional evaluation, mood and food questionnaire along with a depression screening (PHQ9) on all patients, an Epworth questionnaire, sleep habits questionnaire, patient life and health improvement goals questionnaire. These will all be scanned into the patient's chart under media.   During the visit, I independently reviewed the patient's EKG, bioimpedance scale results, and indirect calorimeter results. I used this information to tailor a meal plan for the patient that will help Kara King to lose weight and will improve her obesity-related conditions going forward.  I performed a medically necessary appropriate examination and/or evaluation. I discussed the assessment and treatment plan with the patient. The patient was provided an opportunity to ask questions and all were answered. The patient agreed with the plan and demonstrated an understanding of the instructions. Labs were ordered today (unless patient declined them) and will be reviewed with the patient at our next visit unless more critical results need to be addressed immediately. Clinical information was updated and documented in the EMR.  Time spent on visit including pre-visit chart review and post-visit care was estimated to be 60+ minutes.  A separate 15 minutes was spent on risk counseling (see above/below).   Depression Screen Kara King's Food and Mood (modified PHQ-9) score was 15.  Depression screen Kara King 2/9 08/08/2020  Decreased Interest 2  Down, Depressed, Hopeless 2  PHQ - 2 Score 4  Altered  sleeping 2  Tired, decreased energy 1  Change in appetite 1  Feeling bad or failure about yourself  3  Trouble concentrating 3  Moving slowly or fidgety/restless 1   Suicidal thoughts 0  PHQ-9 Score 15  Difficult doing work/chores Somewhat difficult    Assessment/Plan:   1. Other fatigue Mindy admits to daytime somnolence and denies waking up still tired. Patent has a history of symptoms of daytime fatigue. Kara King generally gets 7 hours of sleep per night, and states that she has generally restful sleep, but wake up with a headache once a week. Kara King is present. Apneic episodes are not present. Epworth Sleepiness Score is 16.  Plan: Check labs today. Follow up with PCP for possible sleep med referral. [name] does feel that her weight is causing her energy to be lower than it should be. Fatigue may be related to obesity, depression or many other causes. Labs will be ordered, and in the meanwhile, [name] will focus on self care including making healthy food choices, increasing physical activity and focusing on stress reduction.  Lab/Orders today or future: - EKG 12-Lead - Vitamin B12 - CBC with Differential/Platelet - Comprehensive metabolic panel - Folate - Hemoglobin A1c - Insulin, random - Lipid panel - T3 - T4, free - TSH - VITAMIN D 25 Hydroxy (Vit-D Deficiency, Fractures)  2. SOBOE (shortness of breath on exertion) [name] notes increasing shortness of breath with exercising and seems to be worsening over time with weight gain. She notes getting out of breath sooner with activity than she used to. This has gotten worse recently. [name] denies shortness of breath at rest or orthopnea.  Plan: Check labs today. Pt has history of RAD many years ago and remote history of tobacco use for 30+ years at 1-2 packs a day. [name] does feel that she gets out of breath more easily that she used to when she exercises. [name]'s shortness of breath appears to be obesity related and exercise induced. She has agreed to work on weight loss and gradually increase exercise to treat her exercise induced shortness of breath. Will continue to  monitor closely.  Lab/Orders today or future: - Vitamin B12 - CBC with Differential/Platelet - Comprehensive metabolic panel - Folate - Hemoglobin A1c - Insulin, random - Lipid panel - T3 - T4, free - TSH - VITAMIN D 25 Hydroxy (Vit-D Deficiency, Fractures)  3. Peripheral edema Pt is on HCTZ daily. Her symptoms are stable and she has no concerns today.  Plan: Check labs today. Continue medication per PCP. Decrease salt, increase water intake, increase activity, and weight loss.  4. Vitamin D deficiency Tashelle is currently taking a daily multi-vitamin. She denies nausea, vomiting or muscle weakness.  Plan: Check labs today. Low Vitamin D level contributes to fatigue and are associated with obesity, breast, and colon cancer. She agrees to continue to take multi-vitamin daily, pending lab results and will follow-up for routine testing of Vitamin D, at least 2-3 times per year to avoid over-replacement.  Lab/Orders today or future: - Vitamin B12 - CBC with Differential/Platelet - Comprehensive metabolic panel - Folate - Hemoglobin A1c - Insulin, random - Lipid panel - T3 - T4, free - TSH - VITAMIN D 25 Hydroxy (Vit-D Deficiency, Fractures)  5. Other constipation Kaniya notes constipation.   Plan: Check labs. Teuta was informed that a decrease in bowel movement frequency is normal while losing weight, but stools should not be hard or painful. Orders and follow up as documented in patient record.  Counseling Getting to Good Bowel Health: Your goal is to have one soft bowel movement each day. Drink at least 8 glasses of water each day. Eat plenty of fiber (goal is over 25 grams each day). It is best to get most of your fiber from dietary sources which includes leafy green vegetables, fresh fruit, and whole grains. You may need to add fiber with the help of OTC fiber supplements. These include Metamucil, Citrucel, and Flaxseed. If you are still having trouble, try  adding Miralax or Magnesium Citrate. If all of these changes do not work, Cabin crew.  6. Migraine without status migrainosus, not intractable, unspecified migraine type Pt's migraines are treated by PCP with Topamax daily and prn Maxalt. She is stable currently.  Plan: Check labs today. Stable symptoms. Continue current treatment plan per PCP.  7. Other depression with emotional eating Natalia is struggling with emotional eating and using food for comfort to the extent that it is negatively impacting her health. She has been working on behavior modification techniques to help reduce her emotional eating. She shows no sign of suicidal or homicidal ideations.  Plan: Check labs. Behavior modification techniques were discussed today to help Sheza deal with her emotional/non-hunger eating behaviors.  Orders and follow up as documented in patient record.   Lab/Orders today or future: - Vitamin B12 - CBC with Differential/Platelet - Comprehensive metabolic panel - Folate - Hemoglobin A1c - Insulin, random - Lipid panel - T3 - T4, free - TSH - VITAMIN D 25 Hydroxy (Vit-D Deficiency, Fractures)  8. At risk for malnutrition Auset was given approximately 30 minutes of counseling today regarding prevention of malnutrition and ways to meet macronutrient goals..   9. Class 3 severe obesity with serious comorbidity and body mass index (BMI) of 40.0 to 44.9 in adult, unspecified obesity type (HCC) Emillie is currently in the action stage of change and her goal is to continue with weight loss efforts. I recommend Aviana begin the structured treatment plan as follows:  She has agreed to the Category 2 Plan.  Exercise goals: As is   Behavioral modification strategies: meal planning and cooking strategies, keeping healthy foods in the home and planning for success.  She was informed of the importance of frequent follow-up visits to maximize her success with intensive  lifestyle modifications for her multiple health conditions. She was informed we would discuss her lab results at her next visit unless there is a critical issue that needs to be addressed sooner. Ellaina agreed to keep her next visit at the agreed upon time to discuss these results.  Objective:   Blood pressure (!) 126/57, pulse (!) 54, temperature 98.4 F (36.9 C), height 5\' 5"  (1.651 m), weight 240 lb (108.9 kg), SpO2 100 %. Body mass index is 39.94 kg/m.  EKG: Normal sinus rhythm, rate 55.  Indirect Calorimeter completed today shows a VO2 of 211 and a REE of 1466.  Her calculated basal metabolic rate is [id number] thus her basal metabolic rate is better than expected.  General: Cooperative, alert, well developed, in no acute distress. HEENT: Conjunctivae and lids unremarkable. Cardiovascular: Regular rhythm.  Lungs: Normal work of breathing. Neurologic: No focal deficits.   Lab Results  Component Value Date   CREATININE 0.77 08/08/2020   BUN 17 08/08/2020   NA 145 (H) 08/08/2020   K 4.6 08/08/2020   CL 108 (H) 08/08/2020   CO2 26 08/08/2020   Lab Results  Component Value Date   ALT 19 08/08/2020  AST 17 08/08/2020   ALKPHOS 65 08/08/2020   BILITOT 0.3 08/08/2020   Lab Results  Component Value Date   HGBA1C 5.6 08/08/2020   Lab Results  Component Value Date   INSULIN 4.5 08/08/2020   Lab Results  Component Value Date   TSH 1.450 08/08/2020   Lab Results  Component Value Date   CHOL 217 (H) 08/08/2020   HDL 71 08/08/2020   LDLCALC 135 (H) 08/08/2020   TRIG 64 08/08/2020   CHOLHDL 3.1 08/08/2020   Lab Results  Component Value Date   WBC 4.9 08/08/2020   HGB 14.0 08/08/2020   HCT 43.8 08/08/2020   MCV 91 08/08/2020   PLT 268 08/08/2020   Attestation Statements:   Reviewed by clinician on day of visit: allergies, medications, problem list, medical history, surgical history, family history, social history, and previous encounter notes.  Coral Ceo,  am acting as Location manager for [employer], DO.  I have reviewed the above documentation for accuracy and completeness, and I agree with the above. Marjory Sneddon, D.O.  The [place] was signed into law in 2016 which includes the topic of electronic health records.  This provides immediate access to information in MyChart.  This includes consultation notes, operative notes, office notes, lab results and pathology reports.  If you have any questions about what you read please let us know at your next visit so we can discuss your concerns and take corrective action if need be.  We are right here with you.

## 2020-08-21 ENCOUNTER — Ambulatory Visit (INDEPENDENT_AMBULATORY_CARE_PROVIDER_SITE_OTHER): Payer: 59 | Admitting: Family Medicine

## 2020-08-21 ENCOUNTER — Other Ambulatory Visit: Payer: Self-pay

## 2020-08-21 ENCOUNTER — Encounter (INDEPENDENT_AMBULATORY_CARE_PROVIDER_SITE_OTHER): Payer: Self-pay | Admitting: Family Medicine

## 2020-08-21 VITALS — BP 103/64 | HR 50 | Temp 98.4°F | Ht 65.0 in | Wt 235.0 lb

## 2020-08-21 DIAGNOSIS — E7849 Other hyperlipidemia: Secondary | ICD-10-CM | POA: Insufficient documentation

## 2020-08-21 DIAGNOSIS — E860 Dehydration: Secondary | ICD-10-CM | POA: Diagnosis not present

## 2020-08-21 DIAGNOSIS — Z9189 Other specified personal risk factors, not elsewhere classified: Secondary | ICD-10-CM

## 2020-08-21 DIAGNOSIS — E559 Vitamin D deficiency, unspecified: Secondary | ICD-10-CM

## 2020-08-21 DIAGNOSIS — Z6839 Body mass index (BMI) 39.0-39.9, adult: Secondary | ICD-10-CM

## 2020-08-21 MED ORDER — VITAMIN D (ERGOCALCIFEROL) 1.25 MG (50000 UNIT) PO CAPS: 50000.0000 [IU] | ORAL_CAPSULE | ORAL | 0 refills | Status: DC

## 2020-08-21 NOTE — Patient Instructions (Signed)
The 10-year ASCVD risk score Mikey Bussing DC Brooke Bonito., et al., 2013) is: 1%   Values used to calculate the score:     Age: 55 years     Sex: Female     Is Non-Hispanic African American: No     Diabetic: No     Tobacco smoker: No     Systolic Blood Pressure: 223 mmHg     Is BP treated: No     HDL Cholesterol: 71 mg/dL     Total Cholesterol: 217 mg/dL

## 2020-08-26 NOTE — Progress Notes (Signed)
Chief Complaint:   OBESITY Kara King is here to discuss her progress with her obesity treatment plan along with follow-up of her obesity related diagnoses.   Today's visit was #: 2 Starting weight: 240 lbs Starting date: 08/08/2020 Today's weight: 235 lbs Today's date: 08/21/2020 Total lbs lost to date: 5 lbs Body mass index is 39.11 kg/m.  Total weight loss percentage to date: -2.08%  Interim History:   Kara King is here today to review her NEW Meal Plan and to discuss all recent labs done here and/ or done at outside facilities. This is patient's first follow up visit. Extended time was spent counseling Kara King on all new disease processes that were discovered or that are worsening.   Kara King denies hunger/cravings.  For snacks, she has had popsicles, [place] bars, and milk.  It was a lot of food to eat, she says.  Denies issues or concerns otherwise.  Nutrition Plan: Category 2 Plan for 100% of the time. Activity: FiA for 45 minutes 4-5 times per week.  Assessment/Plan:   1. Other hyperlipidemia New.  Discussed labs with patient today.  Course: Not at goal. Lipid-lowering medications: None.  She was on a keto diet for around 1 month prior to coming in to see [place].  Elevated HDL and LDL.  Plan:  Dietary changes: Increase soluble fiber, decrease simple carbohydrates, decrease saturated fat. Exercise changes: Moderate to vigorous-intensity aerobic activity 150 minutes per week or as tolerated. We will continue to monitor along with PCP/specialists as it pertains to her weight loss journey.  Lab Results  Component Value Date   CHOL 217 (H) 08/08/2020   HDL 71 08/08/2020   LDLCALC 135 (H) 08/08/2020   TRIG 64 08/08/2020   CHOLHDL 3.1 08/08/2020   Lab Results  Component Value Date   ALT 19 08/08/2020   AST 17 08/08/2020   ALKPHOS 65 08/08/2020   BILITOT 0.3 08/08/2020   The 10-year ASCVD risk score Kara Bussing DC Jr., et al., 2013) is: 1%   Values used to  calculate the score:     Age: 55 years     Sex: Female     Is Non-Hispanic African American: No     Diabetic: No     Tobacco smoker: No     Systolic Blood Pressure: 092 mmHg     Is BP treated: No     HDL Cholesterol: 71 mg/dL     Total Cholesterol: 217 mg/dL  2. Vitamin D deficiency New.  Discussed labs with patient today.  Not at goal. Current vitamin D is 21.9, tested on 08/08/2020. Optimal goal > 50 ng/dL.   Plan: Start to take prescription Vitamin D @50 ,000 IU every week as prescribed.  Follow-up for routine testing of Vitamin D, at least 2-3 times per year to avoid over-replacement.  - Start Vitamin D, Ergocalciferol, (DRISDOL) 1.25 MG (50000 UNIT) CAPS capsule; Take 1 capsule (50,000 Units total) by mouth every 7 (seven) days.  Dispense: 4 capsule; Refill: 0  3. Mild dehydration Discussed labs with patient today.  She denies constipation.  Only occasionally takes HCTZ for bilateral lower extremity edema.  Plan:  Elevated sodium and chloride on labs today.  Counseling done on lowering salt intake, and proper hydration discussed with patient.  4. At risk for heart disease Due to Kara King's current state of health and medical condition(s), she is at a higher risk for heart disease.  This puts the patient at much greater risk to subsequently develop  cardiopulmonary conditions that can significantly affect patient's quality of life in a negative manner.    At least 25 minutes were spent on counseling Kara King about these concerns today, and I stressed the importance of reversing risks factors of obesity, especially truncal and visceral fat, hypertension, hyperlipidemia, and pre-diabetes.  The initial goal is to lose at least 5-10% of starting weight to help reduce these risk factors.  Counseling:  Intensive lifestyle modifications were discussed with Kara King as the most appropriate first line of treatment.  she will continue to work on diet, exercise, and weight loss efforts.  We will  continue to reassess these conditions on a fairly regular basis in an attempt to decrease the patient's overall morbidity and mortality.  Evidence-based interventions for health behavior change were utilized today including the discussion of self monitoring techniques, problem-solving barriers, and SMART goal setting techniques.  Specifically, regarding patient's less desirable eating habits and patterns, we employed the technique of small changes when Kara King has not been able to fully commit to her prudent nutritional plan.  5. Class 2 severe obesity with serious comorbidity and body mass index (BMI) of 39.0 to 39.9 in adult, unspecified obesity type ([place])  Course: Kara King is currently in the action stage of change. As such, her goal is to continue with weight loss efforts.   Nutrition goals: She has agreed to the Category 2 Plan.   Exercise goals: As is.  Behavioral modification strategies: decreasing simple carbohydrates, increasing water intake, meal planning and cooking strategies and planning for success.  Kara King has agreed to follow-up with our clinic in 2 weeks. She was informed of the importance of frequent follow-up visits to maximize her success with intensive lifestyle modifications for her multiple health conditions.   Objective:   Blood pressure 103/64, pulse (!) 50, temperature 98.4 F (36.9 C), height 5\' 5"  (1.651 m), weight 235 lb (106.6 kg), last menstrual period 09/30/2016, SpO2 99 %. Body mass index is 39.11 kg/m.  General: Cooperative, alert, well developed, in no acute distress. HEENT: Conjunctivae and lids unremarkable. Cardiovascular: Regular rhythm.  Lungs: Normal work of breathing. Neurologic: No focal deficits.   Lab Results  Component Value Date   CREATININE 0.77 08/08/2020   BUN 17 08/08/2020   NA 145 (H) 08/08/2020   K 4.6 08/08/2020   CL 108 (H) 08/08/2020   CO2 26 08/08/2020   Lab Results  Component Value Date   ALT 19 08/08/2020   AST  17 08/08/2020   ALKPHOS 65 08/08/2020   BILITOT 0.3 08/08/2020   Lab Results  Component Value Date   HGBA1C 5.6 08/08/2020   Lab Results  Component Value Date   INSULIN 4.5 08/08/2020   Lab Results  Component Value Date   TSH 1.450 08/08/2020   Lab Results  Component Value Date   CHOL 217 (H) 08/08/2020   HDL 71 08/08/2020   LDLCALC 135 (H) 08/08/2020   TRIG 64 08/08/2020   CHOLHDL 3.1 08/08/2020   Lab Results  Component Value Date   WBC 4.9 08/08/2020   HGB 14.0 08/08/2020   HCT 43.8 08/08/2020   MCV 91 08/08/2020   PLT 268 08/08/2020   Attestation Statements:   Reviewed by clinician on day of visit: allergies, medications, problem list, medical history, surgical history, family history, social history, and previous encounter notes.  I, Water quality scientist, CMA, am acting as Location manager for [employer], DO.  I have reviewed the above documentation for accuracy and completeness, and I agree with the above. -  Marjory Sneddon, D.O.  The [place] was signed into law in 2016 which includes the topic of electronic health records.  This provides immediate access to information in MyChart.  This includes consultation notes, operative notes, office notes, lab results and pathology reports.  If you have any questions about what you read please let us know at your next visit so we can discuss your concerns and take corrective action if need be.  We are right here with you.

## 2020-09-16 ENCOUNTER — Other Ambulatory Visit: Payer: Self-pay

## 2020-09-16 ENCOUNTER — Ambulatory Visit (INDEPENDENT_AMBULATORY_CARE_PROVIDER_SITE_OTHER): Payer: 59 | Admitting: Family Medicine

## 2020-09-16 ENCOUNTER — Encounter (INDEPENDENT_AMBULATORY_CARE_PROVIDER_SITE_OTHER): Payer: Self-pay | Admitting: Family Medicine

## 2020-09-16 VITALS — BP 119/69 | HR 47 | Temp 97.9°F | Ht 65.0 in | Wt 225.0 lb

## 2020-09-16 DIAGNOSIS — Z6837 Body mass index (BMI) 37.0-37.9, adult: Secondary | ICD-10-CM

## 2020-09-16 DIAGNOSIS — Z9189 Other specified personal risk factors, not elsewhere classified: Secondary | ICD-10-CM

## 2020-09-16 DIAGNOSIS — E559 Vitamin D deficiency, unspecified: Secondary | ICD-10-CM

## 2020-09-16 MED ORDER — VITAMIN D (ERGOCALCIFEROL) 1.25 MG (50000 UNIT) PO CAPS
50000.0000 [IU] | ORAL_CAPSULE | ORAL | 0 refills | Status: DC
Start: 2020-09-16 — End: 2020-09-30

## 2020-09-17 NOTE — Progress Notes (Signed)
Chief Complaint:   OBESITY Kara King is here to discuss her progress with her obesity treatment plan along with follow-up of her obesity related diagnoses.   Today's visit was #: 3 Starting weight: 240 lbs Starting date: 08/08/2020 Today's weight: 225 lbs Today's date: 09/16/2020 Total lbs lost to date: 15 lbs Body mass index is 37.44 kg/m.  Total weight loss percentage to date: -6.25%  Interim History: Kara King is here for a follow up office visit and she is following the meal plan without concerns or issues.  Patient's meal and food recall appears to be accurate and consistent with what is on the plan.  When on plan, her hunger and cravings are well controlled.     Current Meal Plan: the Category 2 Plan for 98% of the time.  Current Exercise Plan: FiA for 45 minutes 5 times per week.  Assessment/Plan:   Medications Discontinued During This Encounter  Medication Reason  . Vitamin D, Ergocalciferol, (DRISDOL) 1.25 MG (50000 UNIT) CAPS capsule Reorder     Meds ordered this encounter  Medications  . Vitamin D, Ergocalciferol, (DRISDOL) 1.25 MG (50000 UNIT) CAPS capsule    Sig: Take 1 capsule (50,000 Units total) by mouth every 7 (seven) days.    Dispense:  4 capsule    Refill:  0     1. Vitamin D deficiency Not at goal. Current vitamin D is 21.9, tested on 08/08/2020. Optimal goal > 50 ng/dL.  She is taking vitamin D 50,000 IU weekly.  Started at last office visit.  Tolerating well.  Plan: Continue to take prescription Vitamin D @50 ,000 IU every week as prescribed.  Follow-up for routine testing of Vitamin D, at least 2-3 times per year to avoid over-replacement.  - Refill Vitamin D, Ergocalciferol, (DRISDOL) 1.25 MG (50000 UNIT) CAPS capsule; Take 1 capsule (50,000 Units total) by mouth every 7 (seven) days.  Dispense: 4 capsule; Refill: 0  2. At risk for impaired metabolic function Due to Kara King's current state of health and medical condition(s), she is at a  significantly higher risk for impaired metabolic function.  This places the patient at a much greater risk to subsequently develop cardiopulmonary conditions that can negatively affect the patient's quality of life.  At least 8 minutes was spent on counseling Kara King about these concerns today, and I stressed the importance of reversing these risks factors.  The initial goal is to lose at least 5-10% of starting weight to help reduce risk factors.  Counseling:  Intensive lifestyle modifications discussed with Kara King as the most appropriate first line treatment.  she will continue to work on diet, exercise, and weight loss efforts.  We will continue to reassess these conditions on a fairly regular basis in an attempt to decrease the patient's overall morbidity and mortality.   3. Class 2 severe obesity with serious comorbidity and body mass index (BMI) of 37.0 to 37.9 in adult, unspecified obesity type (Kara King)  Course: Kara King is currently in the action stage of change. As such, her goal is to continue with weight loss efforts.   Nutrition goals: She has agreed to the Category 2 Plan.   Exercise goals: As is.  Behavioral modification strategies: increasing lean protein intake, meal planning and cooking strategies, keeping healthy foods in the home and planning for success.  Kara King has agreed to follow-up with our clinic in 2 weeks. She was informed of the importance of frequent follow-up visits to maximize her success with intensive lifestyle modifications for  her multiple health conditions.   Objective:   Blood pressure 119/69, pulse (!) 47, temperature 97.9 F (36.6 C), height 5\' 5"  (1.651 m), weight 225 lb (102.1 kg), last menstrual period 09/30/2016, SpO2 98 %. Body mass index is 37.44 kg/m.  General: Cooperative, alert, well developed, in no acute distress. HEENT: Conjunctivae and lids unremarkable. Cardiovascular: Regular rhythm.  Lungs: Normal work of breathing. Neurologic: No  focal deficits.   Lab Results  Component Value Date   CREATININE 0.77 08/08/2020   BUN 17 08/08/2020   NA 145 (H) 08/08/2020   K 4.6 08/08/2020   CL 108 (H) 08/08/2020   CO2 26 08/08/2020   Lab Results  Component Value Date   ALT 19 08/08/2020   AST 17 08/08/2020   ALKPHOS 65 08/08/2020   BILITOT 0.3 08/08/2020   Lab Results  Component Value Date   HGBA1C 5.6 08/08/2020   Lab Results  Component Value Date   INSULIN 4.5 08/08/2020   Lab Results  Component Value Date   TSH 1.450 08/08/2020   Lab Results  Component Value Date   CHOL 217 (H) 08/08/2020   HDL 71 08/08/2020   LDLCALC 135 (H) 08/08/2020   TRIG 64 08/08/2020   CHOLHDL 3.1 08/08/2020   Lab Results  Component Value Date   WBC 4.9 08/08/2020   HGB 14.0 08/08/2020   HCT 43.8 08/08/2020   MCV 91 08/08/2020   PLT 268 08/08/2020   Attestation Statements:   Reviewed by clinician on day of visit: allergies, medications, problem list, medical history, surgical history, family history, social history, and previous encounter notes.  I, Water quality scientist, CMA, am acting as Location manager for [employer], DO.  I have reviewed the above documentation for accuracy and completeness, and I agree with the above. Kara King, D.O.  The [place] was signed into law in 2016 which includes the topic of electronic health records.  This provides immediate access to information in MyChart.  This includes consultation notes, operative notes, office notes, lab results and pathology reports.  If you have any questions about what you read please let us know at your next visit so we can discuss your concerns and take corrective action if need be.  We are right here with you.

## 2020-09-18 DIAGNOSIS — M7989 Other specified soft tissue disorders: Secondary | ICD-10-CM

## 2020-09-23 ENCOUNTER — Other Ambulatory Visit: Payer: Self-pay | Admitting: Family Medicine

## 2020-09-23 DIAGNOSIS — G43719 Chronic migraine without aura, intractable, without status migrainosus: Secondary | ICD-10-CM

## 2020-09-25 ENCOUNTER — Telehealth (INDEPENDENT_AMBULATORY_CARE_PROVIDER_SITE_OTHER): Payer: Self-pay

## 2020-09-25 NOTE — Telephone Encounter (Signed)
Good morning, can someone please take care of this:   This is a message from Dr Raliegh Scarlet, thank you.  Kara King   Can you please see why I have a 7 am on Mon 21st- it's Kara King. There is nothing documented that I approved it and I don't recall doing so. Please move her to a slot I have open.

## 2020-09-26 NOTE — Telephone Encounter (Signed)
Per the patient Dr. Raliegh Scarlet requested the 7:00.  Sorry it was not documented in the appt notes.  Patient was ok with changing appt time to 9:40.

## 2020-09-30 ENCOUNTER — Encounter (INDEPENDENT_AMBULATORY_CARE_PROVIDER_SITE_OTHER): Payer: Self-pay | Admitting: Family Medicine

## 2020-09-30 ENCOUNTER — Other Ambulatory Visit: Payer: Self-pay

## 2020-09-30 ENCOUNTER — Ambulatory Visit (INDEPENDENT_AMBULATORY_CARE_PROVIDER_SITE_OTHER): Payer: 59 | Admitting: Family Medicine

## 2020-09-30 VITALS — BP 97/56 | HR 55 | Temp 98.1°F | Ht 65.0 in | Wt 223.0 lb

## 2020-09-30 DIAGNOSIS — Z6839 Body mass index (BMI) 39.0-39.9, adult: Secondary | ICD-10-CM

## 2020-09-30 DIAGNOSIS — E559 Vitamin D deficiency, unspecified: Secondary | ICD-10-CM

## 2020-09-30 DIAGNOSIS — Z9189 Other specified personal risk factors, not elsewhere classified: Secondary | ICD-10-CM | POA: Diagnosis not present

## 2020-09-30 MED ORDER — VITAMIN D (ERGOCALCIFEROL) 1.25 MG (50000 UNIT) PO CAPS
50000.0000 [IU] | ORAL_CAPSULE | ORAL | 0 refills | Status: DC
Start: 2020-09-30 — End: 2020-11-12

## 2020-10-07 NOTE — Progress Notes (Signed)
Chief Complaint:   OBESITY Kara King is here to discuss her progress with her obesity treatment plan along with follow-up of her obesity related diagnoses.   Today's visit was #: 4 Starting weight: 240 lbs Starting date: 08/08/2020 Today's weight: 223 lbs Today's date: 09/30/2020 Total lbs lost to date: 17 lbs Body mass index is 37.11 kg/m.  Total weight loss percentage to date: -7.08%  Interim History:  Kara King is here for a follow up office visit and she is following the meal plan without concerns or issues.  Patient's meal and food recall appears to be accurate and consistent with what is on the plan.  When on plan, her hunger and cravings are well controlled.    Plan:  Handouts given on boredom eating/emotional eating strategies after discussion was had regarding those strategies.  Continue with her counselor every 2-4 weeks.  Current Meal Plan: the Category 2 Plan for 98% of the time.  Current Exercise Plan: FiA (Females in Action).  Assessment/Plan:    Medications Discontinued During This Encounter  Medication Reason  . Vitamin D, Ergocalciferol, (DRISDOL) 1.25 MG (50000 UNIT) CAPS capsule Reorder     Meds ordered this encounter  Medications  . Vitamin D, Ergocalciferol, (DRISDOL) 1.25 MG (50000 UNIT) CAPS capsule    Sig: Take 1 capsule (50,000 Units total) by mouth every 7 (seven) days.    Dispense:  4 capsule    Refill:  0     1. Vitamin D deficiency Not at goal. Current vitamin D is 21.9, tested on 08/08/2020. Optimal goal > 50 ng/dL.  She is taking vitamin D 50,000 IU weekly.  Plan: Continue to take prescription Vitamin D @50 ,000 IU every week as prescribed.  Follow-up for routine testing of Vitamin D, at least 2-3 times per year to avoid over-replacement.  - Refill Vitamin D, Ergocalciferol, (DRISDOL) 1.25 MG (50000 UNIT) CAPS capsule; Take 1 capsule (50,000 Units total) by mouth every 7 (seven) days.  Dispense: 4 capsule; Refill: 0  2. At risk  for dehydration Ellinor is at higher than average risk of dehydration.  Kathreen was given more than 8 minutes of proper hydration counseling today.  We discussed the signs and symptoms of dehydration, some of which may include muscle cramping, constipation or even orthostatic symptoms.  Counseling on the prevention of dehydration was also provided today.  Louretta is at risk for dehydration due to weight loss, lifestyle and behavorial habits and possibly due to taking certain medication(s).  She was encouraged to adequately hydrate and monitor fluid status to avoid dehydration as well as weight loss plateaus.  Unless pre-existing renal or cardiopulmonary conditions exist, in which patient was told to limit their fluid intake, I recommended roughly one half of their weight in pounds to be the approximate ounces of non-caloric, non-caffeinated beverages they should drink per day; including more if they are engaging in exercise.  3. Obesity, current BMI 37.2  Course: [name] is currently in the action stage of change. As such, her goal is to continue with weight loss efforts.   Nutrition goals: She has agreed to the Category 2 Plan.   Exercise goals: As is.  Behavioral modification strategies: better snacking choices, emotional eating strategies and avoiding temptations.  [name] has agreed to follow-up with our clinic in 2-3 weeks. She was informed of the importance of frequent follow-up visits to maximize her success with intensive lifestyle modifications for her multiple health conditions.   Objective:   Blood pressure [name])  97/56, pulse (!) 55, temperature 98.1 F (36.7 C), height 5\' 5"  (1.651 m), weight 223 lb (101.2 kg), last menstrual period 09/30/2016, SpO2 99 %. Body mass index is 37.11 kg/m.  General: Cooperative, alert, well developed, in no acute distress. HEENT: Conjunctivae and lids unremarkable. Cardiovascular: Regular rhythm.  Lungs: Normal work of breathing. Neurologic:  No focal deficits.   Lab Results  Component Value Date   CREATININE 0.77 08/08/2020   BUN 17 08/08/2020   NA 145 (H) 08/08/2020   K 4.6 08/08/2020   CL 108 (H) 08/08/2020   CO2 26 08/08/2020   Lab Results  Component Value Date   ALT 19 08/08/2020   AST 17 08/08/2020   ALKPHOS 65 08/08/2020   BILITOT 0.3 08/08/2020   Lab Results  Component Value Date   HGBA1C 5.6 08/08/2020   Lab Results  Component Value Date   INSULIN 4.5 08/08/2020   Lab Results  Component Value Date   TSH 1.450 08/08/2020   Lab Results  Component Value Date   CHOL 217 (H) 08/08/2020   HDL 71 08/08/2020   LDLCALC 135 (H) 08/08/2020   TRIG 64 08/08/2020   CHOLHDL 3.1 08/08/2020   Lab Results  Component Value Date   WBC 4.9 08/08/2020   HGB 14.0 08/08/2020   HCT 43.8 08/08/2020   MCV 91 08/08/2020   PLT 268 08/08/2020   Attestation Statements:   Reviewed by clinician on day of visit: allergies, medications, problem list, medical history, surgical history, family history, social history, and previous encounter notes.  I, Water quality scientist, CMA, am acting as Location manager for [employer], DO.  I have reviewed the above documentation for accuracy and completeness, and I agree with the above. Marjory Sneddon, D.O.  The [place] was signed into law in 2016 which includes the topic of electronic health records.  This provides immediate access to information in MyChart.  This includes consultation notes, operative notes, office notes, lab results and pathology reports.  If you have any questions about what you read please let us know at your next visit so we can discuss your concerns and take corrective action if need be.  We are right here with you.

## 2020-10-21 ENCOUNTER — Ambulatory Visit (INDEPENDENT_AMBULATORY_CARE_PROVIDER_SITE_OTHER): Payer: 59 | Admitting: Family Medicine

## 2020-11-08 ENCOUNTER — Other Ambulatory Visit: Payer: Self-pay | Admitting: Family Medicine

## 2020-11-08 DIAGNOSIS — G43719 Chronic migraine without aura, intractable, without status migrainosus: Secondary | ICD-10-CM

## 2020-11-12 ENCOUNTER — Other Ambulatory Visit: Payer: Self-pay

## 2020-11-12 ENCOUNTER — Encounter (INDEPENDENT_AMBULATORY_CARE_PROVIDER_SITE_OTHER): Payer: Self-pay | Admitting: Family Medicine

## 2020-11-12 ENCOUNTER — Ambulatory Visit (INDEPENDENT_AMBULATORY_CARE_PROVIDER_SITE_OTHER): Payer: 59 | Admitting: Family Medicine

## 2020-11-12 VITALS — BP 100/55 | HR 52 | Temp 97.8°F | Ht 65.0 in | Wt 215.0 lb

## 2020-11-12 DIAGNOSIS — Z9189 Other specified personal risk factors, not elsewhere classified: Secondary | ICD-10-CM | POA: Diagnosis not present

## 2020-11-12 DIAGNOSIS — E7849 Other hyperlipidemia: Secondary | ICD-10-CM | POA: Diagnosis not present

## 2020-11-12 DIAGNOSIS — E870 Hyperosmolality and hypernatremia: Secondary | ICD-10-CM | POA: Diagnosis not present

## 2020-11-12 DIAGNOSIS — Z6839 Body mass index (BMI) 39.0-39.9, adult: Secondary | ICD-10-CM

## 2020-11-12 DIAGNOSIS — E559 Vitamin D deficiency, unspecified: Secondary | ICD-10-CM | POA: Diagnosis not present

## 2020-11-12 MED ORDER — VITAMIN D (ERGOCALCIFEROL) 1.25 MG (50000 UNIT) PO CAPS
50000.0000 [IU] | ORAL_CAPSULE | ORAL | 0 refills | Status: DC
Start: 2020-11-12 — End: 2020-11-28

## 2020-11-20 NOTE — Progress Notes (Signed)
Chief Complaint:   OBESITY Kara King is here to discuss her progress with her obesity treatment plan along with follow-up of her obesity related diagnoses.   Today's visit was #: 5 Starting weight: 240 lbs Starting date: 08/08/2020 Today's weight: 215 lbs Today's date: 11/12/2020 Weight change since last visit:  8 lbs Total lbs lost to date: 25 lbs Body mass index is 35.78 kg/m.  Total weight loss percentage to date: -10.42%  Interim History:  Kara King is here for a follow up office visit and she is following the meal plan without concerns or issues.  Patient's meal and food recall appears to be accurate and consistent with what is on the plan.  When on plan, her hunger and cravings are well controlled.    Plan:  Handouts given on 5-10% weight loss and benefits to body.  Current Meal Plan: the Category 2 Plan for 90% of the time.  Current Exercise Plan: FiA (Females in Action) for 45 minutes 5 times per week.  Assessment/Plan:   Medications Discontinued During This Encounter  Medication Reason  . Vitamin D, Ergocalciferol, (DRISDOL) 1.25 MG (50000 UNIT) CAPS capsule Reorder     Meds ordered this encounter  Medications  . Vitamin D, Ergocalciferol, (DRISDOL) 1.25 MG (50000 UNIT) CAPS capsule    Sig: Take 1 capsule (50,000 Units total) by mouth every 7 (seven) days.    Dispense:  4 capsule    Refill:  0    1. Vitamin D deficiency Not at goal. Current vitamin D is 21.9, tested on 08/08/2020. Optimal goal > 50 ng/dL.  She is taking vitamin D 50,000 IU weekly.  Plan: Continue to take prescription Vitamin D @50 ,000 IU every week as prescribed.  Follow-up for routine testing of Vitamin D, at least 2-3 times per year to avoid over-replacement.  - Refill Vitamin D, Ergocalciferol, (DRISDOL) 1.25 MG (50000 UNIT) CAPS capsule; Take 1 capsule (50,000 Units total) by mouth every 7 (seven) days.  Dispense: 4 capsule; Refill: 0  2. Other hyperlipidemia Course: Not at goal.  Lipid-lowering medications: None.   Plan:  Will recheck levels at next office visit.  Continue prudent nutritional plan and exercise.    Dietary changes: Increase soluble fiber, decrease simple carbohydrates, decrease saturated fat. Exercise changes: Moderate to vigorous-intensity aerobic activity 150 minutes per week or as tolerated. We will continue to monitor along with PCP/specialists as it pertains to her weight loss journey.  Lab Results  Component Value Date   CHOL 217 (H) 08/08/2020   HDL 71 08/08/2020   LDLCALC 135 (H) 08/08/2020   TRIG 64 08/08/2020   CHOLHDL 3.1 08/08/2020   Lab Results  Component Value Date   ALT 19 08/08/2020   AST 17 08/08/2020   ALKPHOS 65 08/08/2020   BILITOT 0.3 08/08/2020   The 10-year ASCVD risk score Mikey Bussing DC Jr., et al., 2013) is: 0.9%   Values used to calculate the score:     Age: 55 years     Sex: Female     Is Non-Hispanic African American: No     Diabetic: No     Tobacco smoker: No     Systolic Blood Pressure: 956 mmHg     Is BP treated: No     HDL Cholesterol: 71 mg/dL     Total Cholesterol: 217 mg/dL  3. Hypernatremia BMP shows sodium level of 145 on 08/08/2020.  Plan:  Recheck BMP at next office visit.  Increase water intake to half of  weight in ounces of water per day.  4. At risk for dehydration Kara King is at higher than average risk of dehydration.  Kara King was given more than 8 minutes of proper hydration counseling today.  We discussed the signs and symptoms of dehydration, some of which may include muscle cramping, constipation or even orthostatic symptoms.  Counseling on the prevention of dehydration was also provided today.  Kara King is at risk for dehydration due to weight loss, lifestyle and behavorial habits and possibly due to taking certain medication(s).  She was encouraged to adequately hydrate and monitor fluid status to avoid dehydration as well as weight loss plateaus.  Unless pre-existing renal or  cardiopulmonary conditions exist, in which patient was told to limit their fluid intake, I recommended roughly one half of their weight in pounds to be the approximate ounces of non-caloric, non-caffeinated beverages they should drink per day; including more if they are engaging in exercise.  5. Obesity, current BMI 35.8  Course: Kara King is currently in the action stage of change. As such, her goal is to continue with weight loss efforts.   Nutrition goals: She has agreed to the Category 2 Plan.   Exercise goals: As is.  Behavioral modification strategies: increasing lean protein intake and emotional eating strategies.  Kara King has agreed to follow-up with our clinic in 3 weeks, fasting. She was informed of the importance of frequent follow-up visits to maximize her success with intensive lifestyle modifications for her multiple health conditions.   Objective:   Blood pressure (!) 100/55, pulse (!) 52, temperature 97.8 F (36.6 C), height 5\' 5"  (1.651 m), weight 215 lb (97.5 kg), last menstrual period 09/30/2016, SpO2 97 %. Body mass index is 35.78 kg/m.  General: Cooperative, alert, well developed, in no acute distress. HEENT: Conjunctivae and lids unremarkable. Cardiovascular: Regular rhythm.  Lungs: Normal work of breathing. Neurologic: No focal deficits.   Lab Results  Component Value Date   CREATININE 0.77 08/08/2020   BUN 17 08/08/2020   NA 145 (H) 08/08/2020   K 4.6 08/08/2020   CL 108 (H) 08/08/2020   CO2 26 08/08/2020   Lab Results  Component Value Date   ALT 19 08/08/2020   AST 17 08/08/2020   ALKPHOS 65 08/08/2020   BILITOT 0.3 08/08/2020   Lab Results  Component Value Date   HGBA1C 5.6 08/08/2020   Lab Results  Component Value Date   INSULIN 4.5 08/08/2020   Lab Results  Component Value Date   TSH 1.450 08/08/2020   Lab Results  Component Value Date   CHOL 217 (H) 08/08/2020   HDL 71 08/08/2020   LDLCALC 135 (H) 08/08/2020   TRIG 64  08/08/2020   CHOLHDL 3.1 08/08/2020   Lab Results  Component Value Date   WBC 4.9 08/08/2020   HGB 14.0 08/08/2020   HCT 43.8 08/08/2020   MCV 91 08/08/2020   PLT 268 08/08/2020   Attestation Statements:   Reviewed by clinician on day of visit: allergies, medications, problem list, medical history, surgical history, family history, social history, and previous encounter notes.  I, Water quality scientist, CMA, am acting as Location manager for [employer], DO.  I have reviewed the above documentation for accuracy and completeness, and I agree with the above. Marjory Sneddon, D.O.  The [place] was signed into law in 2016 which includes the topic of electronic health records.  This provides immediate access to information in MyChart.  This includes consultation notes, operative notes, office  notes, lab results and pathology reports.  If you have any questions about what you read please let us know at your next visit so we can discuss your concerns and take corrective action if need be.  We are right here with you.

## 2020-11-28 ENCOUNTER — Other Ambulatory Visit: Payer: Self-pay

## 2020-11-28 ENCOUNTER — Encounter (INDEPENDENT_AMBULATORY_CARE_PROVIDER_SITE_OTHER): Payer: Self-pay | Admitting: Family Medicine

## 2020-11-28 ENCOUNTER — Ambulatory Visit (INDEPENDENT_AMBULATORY_CARE_PROVIDER_SITE_OTHER): Payer: 59 | Admitting: Family Medicine

## 2020-11-28 VITALS — BP 122/56 | HR 48 | Temp 97.7°F | Ht 65.0 in | Wt 217.0 lb

## 2020-11-28 DIAGNOSIS — Z9189 Other specified personal risk factors, not elsewhere classified: Secondary | ICD-10-CM | POA: Diagnosis not present

## 2020-11-28 DIAGNOSIS — R519 Headache, unspecified: Secondary | ICD-10-CM

## 2020-11-28 DIAGNOSIS — E559 Vitamin D deficiency, unspecified: Secondary | ICD-10-CM

## 2020-11-28 DIAGNOSIS — E7849 Other hyperlipidemia: Secondary | ICD-10-CM

## 2020-11-28 DIAGNOSIS — Z6839 Body mass index (BMI) 39.0-39.9, adult: Secondary | ICD-10-CM

## 2020-11-28 DIAGNOSIS — E870 Hyperosmolality and hypernatremia: Secondary | ICD-10-CM

## 2020-11-28 MED ORDER — METFORMIN HCL 500 MG PO TABS: ORAL_TABLET | ORAL | 0 refills | Status: DC

## 2020-11-28 MED ORDER — VITAMIN D (ERGOCALCIFEROL) 1.25 MG (50000 UNIT) PO CAPS: 50000.0000 [IU] | ORAL_CAPSULE | ORAL | 0 refills | Status: DC

## 2020-12-04 NOTE — Progress Notes (Signed)
Chief Complaint:   OBESITY Kara King is here to discuss her progress with her obesity treatment plan along with follow-up of her obesity related diagnoses.   Today's visit was #: 6 Starting weight: 240 lbs Starting date: 08/08/2020 Today's weight: 217 lbs Today's date: 11/28/2020 Weight change since last visit: +2 lbs Total lbs lost to date: 23 lbs Body mass index is 36.11 kg/m.  Total weight loss percentage to date: -9.58%  Interim History:  Kara King has been struggling with her snack calories.  She goes over and is not calculating them.  Plan:  Start metformin for carb craving help, especially since she is without history of IR or prediabetes and has limited drug coverage.  Current Meal Plan: the Category 2 Plan for 95% of the time.  Current Exercise Plan: FiA for 45 minutes 4 times per week.  Assessment/Plan:   Medications Discontinued During This Encounter  Medication Reason  . Vitamin D, Ergocalciferol, (DRISDOL) 1.25 MG (50000 UNIT) CAPS capsule Reorder  . hydrochlorothiazide (HYDRODIURIL) 25 MG tablet    Meds ordered this encounter  Medications  . Vitamin D, Ergocalciferol, (DRISDOL) 1.25 MG (50000 UNIT) CAPS capsule    Sig: Take 1 capsule (50,000 Units total) by mouth every 7 (seven) days.    Dispense:  4 capsule    Refill:  0  . metFORMIN (GLUCOPHAGE) 500 MG tablet    Sig: 1 po qd with lunch    Dispense:  30 tablet    Refill:  0    Ov for RF   1. Vitamin D deficiency Not at goal. Current vitamin D is 21.9, tested on 08/08/2020. Optimal goal > 50 ng/dL.  She is taking vitamin D 50,000 IU weekly.  Plan: Continue to take prescription Vitamin D @50 ,000 IU every week as prescribed.  Follow-up for routine testing of Vitamin D, at least 2-3 times per year to avoid over-replacement.  - Refill Vitamin D, Ergocalciferol, (DRISDOL) 1.25 MG (50000 UNIT) CAPS capsule; Take 1 capsule (50,000 Units total) by mouth every 7 (seven) days.  Dispense: 4 capsule; Refill:  0  2. Nonintractable headache, unspecified chronicity pattern, unspecified headache type She is only taking Topamax once daily instead of twice daily.  No headaches or issues.  Plan:  Take twice daily as prescribed!  Counseling done on benefits associated with this medication and weight loss.  3. At risk for side effect of medication Due to Grant starting a new medication, she is at a higher risk for drug side effect.  At least 9 minutes was spent on counseling her about these concerns today.  We discussed the benefits and potential risks of these medications, and all of patient's concerns were addressed and questions were answered.  she will call us, or their PCP or other specialists who treat their conditions with medications, with any questions or concerns that may develop.    4. Obesity, current BMI 36.1  - Start metFORMIN (GLUCOPHAGE) 500 MG tablet; 1 po qd with lunch  Dispense: 30 tablet; Refill: 0  Course: Kara King is currently in the action stage of change. As such, her goal is to continue with weight loss efforts.   Nutrition goals: She has agreed to the Category 2 Plan, but with only 100 snack calories.   Exercise goals: As is.  Behavioral modification strategies: better snacking choices and avoiding temptations.  Kara King has agreed to follow-up with our clinic in 2-3 weeks, fasting. She was informed of the importance of frequent follow-up visits to maximize her  success with intensive lifestyle modifications for her multiple health conditions.   Objective:   Blood pressure (!) 122/56, pulse (!) 48, temperature 97.7 F (36.5 C), height 5\' 5"  (1.651 m), weight 217 lb (98.4 kg), last menstrual period 09/30/2016, SpO2 98 %. Body mass index is 36.11 kg/m.  General: Cooperative, alert, well developed, in no acute distress. HEENT: Conjunctivae and lids unremarkable. Cardiovascular: Regular rhythm.  Lungs: Normal work of breathing. Neurologic: No focal deficits.   Lab  Results  Component Value Date   CREATININE 0.77 08/08/2020   BUN 17 08/08/2020   NA 145 (H) 08/08/2020   K 4.6 08/08/2020   CL 108 (H) 08/08/2020   CO2 26 08/08/2020   Lab Results  Component Value Date   ALT 19 08/08/2020   AST 17 08/08/2020   ALKPHOS 65 08/08/2020   BILITOT 0.3 08/08/2020   Lab Results  Component Value Date   HGBA1C 5.6 08/08/2020   Lab Results  Component Value Date   INSULIN 4.5 08/08/2020   Lab Results  Component Value Date   TSH 1.450 08/08/2020   Lab Results  Component Value Date   CHOL 217 (H) 08/08/2020   HDL 71 08/08/2020   LDLCALC 135 (H) 08/08/2020   TRIG 64 08/08/2020   CHOLHDL 3.1 08/08/2020   Lab Results  Component Value Date   WBC 4.9 08/08/2020   HGB 14.0 08/08/2020   HCT 43.8 08/08/2020   MCV 91 08/08/2020   PLT 268 08/08/2020   Attestation Statements:   Reviewed by clinician on day of visit: allergies, medications, problem list, medical history, surgical history, family history, social history, and previous encounter notes.  I, Water quality scientist, CMA, am acting as Location manager for [employer], DO.  I have reviewed the above documentation for accuracy and completeness, and I agree with the above. Kara King, D.O.  The [place] was signed into law in 2016 which includes the topic of electronic health records.  This provides immediate access to information in MyChart.  This includes consultation notes, operative notes, office notes, lab results and pathology reports.  If you have any questions about what you read please let us know at your next visit so we can discuss your concerns and take corrective action if need be.  We are right here with you.

## 2020-12-18 ENCOUNTER — Encounter (INDEPENDENT_AMBULATORY_CARE_PROVIDER_SITE_OTHER): Payer: Self-pay | Admitting: Family Medicine

## 2020-12-18 ENCOUNTER — Other Ambulatory Visit: Payer: Self-pay

## 2020-12-18 ENCOUNTER — Ambulatory Visit (INDEPENDENT_AMBULATORY_CARE_PROVIDER_SITE_OTHER): Payer: 59 | Admitting: Family Medicine

## 2020-12-18 VITALS — BP 119/71 | HR 53 | Temp 98.5°F | Ht 65.0 in | Wt 209.0 lb

## 2020-12-18 DIAGNOSIS — E870 Hyperosmolality and hypernatremia: Secondary | ICD-10-CM | POA: Diagnosis not present

## 2020-12-18 DIAGNOSIS — E559 Vitamin D deficiency, unspecified: Secondary | ICD-10-CM

## 2020-12-18 DIAGNOSIS — Z9189 Other specified personal risk factors, not elsewhere classified: Secondary | ICD-10-CM | POA: Diagnosis not present

## 2020-12-18 DIAGNOSIS — E7849 Other hyperlipidemia: Secondary | ICD-10-CM | POA: Diagnosis not present

## 2020-12-18 DIAGNOSIS — Z6839 Body mass index (BMI) 39.0-39.9, adult: Secondary | ICD-10-CM

## 2020-12-18 NOTE — Patient Instructions (Signed)
The 10-year ASCVD risk score Kara King., et al., 2013) is: 1.3%   Values used to calculate the score:     Age: 55 years     Sex: Female     Is Non-Hispanic African American: No     Diabetic: No     Tobacco smoker: No     Systolic Blood Pressure: 185 mmHg     Is BP treated: No     HDL Cholesterol: 71 mg/dL     Total Cholesterol: 217 mg/dL

## 2020-12-19 LAB — COMPREHENSIVE METABOLIC PANEL
ALT: 18 IU/L (ref 0–32)
AST: 23 IU/L (ref 0–40)
Albumin/Globulin Ratio: 2.4 — ABNORMAL HIGH (ref 1.2–2.2)
Albumin: 4.4 g/dL (ref 3.8–4.9)
Alkaline Phosphatase: 81 IU/L (ref 44–121)
BUN/Creatinine Ratio: 21 (ref 9–23)
BUN: 16 mg/dL (ref 6–24)
Bilirubin Total: 0.4 mg/dL (ref 0.0–1.2)
CO2: 22 mmol/L (ref 20–29)
Calcium: 9.5 mg/dL (ref 8.7–10.2)
Chloride: 110 mmol/L — ABNORMAL HIGH (ref 96–106)
Creatinine, Ser: 0.75 mg/dL (ref 0.57–1.00)
Globulin, Total: 1.8 g/dL (ref 1.5–4.5)
Glucose: 86 mg/dL (ref 65–99)
Potassium: 4.4 mmol/L (ref 3.5–5.2)
Sodium: 146 mmol/L — ABNORMAL HIGH (ref 134–144)
Total Protein: 6.2 g/dL (ref 6.0–8.5)
eGFR: 95 mL/min/{1.73_m2} (ref >59)

## 2020-12-19 LAB — LIPID PANEL
Chol/HDL Ratio: 3.3 ratio (ref 0.0–4.4)
Cholesterol, Total: 213 mg/dL — ABNORMAL HIGH (ref 100–199)
HDL: 65 mg/dL (ref >39)
LDL Chol Calc (NIH): 136 mg/dL — ABNORMAL HIGH (ref 0–99)
Triglycerides: 69 mg/dL (ref 0–149)
VLDL Cholesterol Cal: 12 mg/dL (ref 5–40)

## 2020-12-19 LAB — VITAMIN D 25 HYDROXY (VIT D DEFICIENCY, FRACTURES): Vit D, 25-Hydroxy: 55.6 ng/mL (ref 30.0–100.0)

## 2020-12-25 NOTE — Progress Notes (Signed)
Chief Complaint:   OBESITY Kara King is here to discuss her progress with her obesity treatment plan along with follow-up of her obesity related diagnoses.   Today's visit was #: 7 Starting weight: 240 lbs Starting date: 08/08/2020 Today's weight: 209 lbs Today's date: 12/18/2020 Weight change since last visit: 8 lbs Total lbs lost to date: 31 lbs Body mass index is 34.78 kg/m.  Total weight loss percentage to date: -12.92%  Interim History:  [name] says she snacked less and ate every ounce of the protein on plan.  She also snacked on [place], ham, etc.  She filled the metformin but never started it and now does not feel that she needs to.  Denies hunger and cravings.  Drinking 100 ounces of water per day.  Current Meal Plan: the Category 2 Plan +100 snack calories for 95% of the time.  Current Exercise Plan: FiA for 45 minutes 4-5 times per week.   Assessment/Plan:   Orders Placed This Encounter  Procedures   Lipid panel   VITAMIN D 25 Hydroxy (Vit-D Deficiency, Fractures)   Comprehensive metabolic panel   1. Other hyperlipidemia Course: Not at goal. Lipid-lowering medications: None.  ASCVD is <2.0% but cholesterol is elevated.  Plan: Dietary changes: Increase soluble fiber, decrease simple carbohydrates, decrease saturated fat. Exercise changes: Moderate to vigorous-intensity aerobic activity 150 minutes per week or as tolerated. We will continue to monitor along with PCP/specialists as it pertains to her weight loss journey.  Will check FLP today.  Lab Results  Component Value Date   CHOL 213 (H) 12/18/2020   HDL 65 12/18/2020   LDLCALC 136 (H) 12/18/2020   TRIG 69 12/18/2020   CHOLHDL 3.3 12/18/2020   Lab Results  Component Value Date   ALT 18 12/18/2020   AST 23 12/18/2020   ALKPHOS 81 12/18/2020   BILITOT 0.4 12/18/2020   The 10-year ASCVD risk score Mikey Bussing DC Jr., et al., 2013) is: 1.4%   Values used to calculate the score:     Age: 55 years     Sex:  Female     Is Non-Hispanic African American: No     Diabetic: No     Tobacco smoker: No     Systolic Blood Pressure: 737 mmHg     Is BP treated: No     HDL Cholesterol: 65 mg/dL     Total Cholesterol: 213 mg/dL  - Lipid panel  2. Vitamin D deficiency Not at goal. Current vitamin D is 21.9, tested on 08/08/2020. Optimal goal > 50 ng/dL.  She is taking vitamin D 50,000 IU weekly.  Plan: Continue to take prescription Vitamin D @50 ,000 IU every week as prescribed.  Will check vitamin D level today.  - VITAMIN D 25 Hydroxy (Vit-D Deficiency, Fractures)  3. Hypernatremia [name]'s potassium level was 95 on 08/08/2020.  She has increased her water intake from prior.  Plan:  Will check CMP today.  Lab Results  Component Value Date   K 95 (H) 08/08/2020   - Comprehensive metabolic panel  4. At risk for dehydration [name] is at higher than average risk of dehydration.  [name] was given more than 9 minutes of proper hydration counseling today.  We discussed the signs and symptoms of dehydration, some of which may include muscle cramping, constipation or even orthostatic symptoms.  Counseling on the prevention of dehydration was also provided today.  [name] is at risk for dehydration due to weight loss, lifestyle and behavorial habits and possibly due  to taking certain medication(s).  She was encouraged to adequately hydrate and monitor fluid status to avoid dehydration as well as weight loss plateaus.  Unless pre-existing renal or cardiopulmonary conditions exist, in which patient was told to limit their fluid intake, I recommended roughly one half of their weight in pounds to be the approximate ounces of non-caloric, non-caffeinated beverages they should drink per day; including more if they are engaging in exercise.  5. Obesity, current BMI 34.9  Course: [name] is currently in the action stage of change. As such, her goal is to continue with weight loss efforts.   Nutrition  goals: She has agreed to the Category 2 Plan +100 snack calories.   Exercise goals:  As is.  Behavioral modification strategies: increasing water intake, keeping healthy foods in the home, better snacking choices, and avoiding temptations.  [name] has agreed to follow-up with our clinic in 3 weeks. She was informed of the importance of frequent follow-up visits to maximize her success with intensive lifestyle modifications for her multiple health conditions.   [name] was informed we would discuss her lab results at her next visit unless there is a critical issue that needs to be addressed sooner. [name] agreed to keep her next visit at the agreed upon time to discuss these results.  Objective:   Blood pressure 119/71, pulse (!) 53, temperature 98.5 F (36.9 C), height 5\' 5"  (1.651 m), weight 209 lb (94.8 kg), last menstrual period 09/30/2016, SpO2 98 %. Body mass index is 34.78 kg/m.  General: Cooperative, alert, well developed, in no acute distress. HEENT: Conjunctivae and lids unremarkable. Cardiovascular: Regular rhythm.  Lungs: Normal work of breathing. Neurologic: No focal deficits.   Lab Results  Component Value Date   CREATININE 0.75 12/18/2020   BUN 16 12/18/2020   NA 146 (H) 12/18/2020   K 4.4 12/18/2020   CL 110 (H) 12/18/2020   CO2 22 12/18/2020   Lab Results  Component Value Date   ALT 18 12/18/2020   AST 23 12/18/2020   ALKPHOS 81 12/18/2020   BILITOT 0.4 12/18/2020   Lab Results  Component Value Date   HGBA1C 5.6 08/08/2020   Lab Results  Component Value Date   INSULIN 4.5 08/08/2020   Lab Results  Component Value Date   TSH 1.450 08/08/2020   Lab Results  Component Value Date   CHOL 213 (H) 12/18/2020   HDL 65 12/18/2020   LDLCALC 136 (H) 12/18/2020   TRIG 69 12/18/2020   CHOLHDL 3.3 12/18/2020   Lab Results  Component Value Date   WBC 4.9 08/08/2020   HGB 14.0 08/08/2020   HCT 43.8 08/08/2020   MCV 91 08/08/2020   PLT 268  08/08/2020   Attestation Statements:   Reviewed by clinician on day of visit: allergies, medications, problem list, medical history, surgical history, family history, social history, and previous encounter notes.  I, Water quality scientist, CMA, am acting as Location manager for [employer], DO.  I have reviewed the above documentation for accuracy and completeness, and I agree with the above. Marjory Sneddon, D.O.  The [place] was signed into law in 2016 which includes the topic of electronic health records.  This provides immediate access to information in MyChart.  This includes consultation notes, operative notes, office notes, lab results and pathology reports.  If you have any questions about what you read please let us know at your next visit so we can discuss your concerns and take corrective action  if need be.  We are right here with you.

## 2020-12-27 ENCOUNTER — Other Ambulatory Visit: Payer: Self-pay | Admitting: Family Medicine

## 2020-12-27 DIAGNOSIS — G43719 Chronic migraine without aura, intractable, without status migrainosus: Secondary | ICD-10-CM

## 2021-01-01 ENCOUNTER — Other Ambulatory Visit: Payer: Self-pay | Admitting: Family Medicine

## 2021-01-01 DIAGNOSIS — G43719 Chronic migraine without aura, intractable, without status migrainosus: Secondary | ICD-10-CM

## 2021-01-08 ENCOUNTER — Ambulatory Visit (INDEPENDENT_AMBULATORY_CARE_PROVIDER_SITE_OTHER): Payer: 59 | Admitting: Family Medicine

## 2021-01-08 ENCOUNTER — Other Ambulatory Visit: Payer: Self-pay

## 2021-01-08 VITALS — BP 103/64 | HR 66 | Temp 98.1°F | Ht 65.0 in | Wt 209.0 lb

## 2021-01-08 DIAGNOSIS — Z6839 Body mass index (BMI) 39.0-39.9, adult: Secondary | ICD-10-CM

## 2021-01-08 DIAGNOSIS — E559 Vitamin D deficiency, unspecified: Secondary | ICD-10-CM | POA: Diagnosis not present

## 2021-01-08 DIAGNOSIS — E7849 Other hyperlipidemia: Secondary | ICD-10-CM | POA: Diagnosis not present

## 2021-01-08 DIAGNOSIS — Z9189 Other specified personal risk factors, not elsewhere classified: Secondary | ICD-10-CM | POA: Diagnosis not present

## 2021-01-08 DIAGNOSIS — E870 Hyperosmolality and hypernatremia: Secondary | ICD-10-CM

## 2021-01-08 MED ORDER — VITAMIN D (ERGOCALCIFEROL) 1.25 MG (50000 UNIT) PO CAPS
50000.0000 [IU] | ORAL_CAPSULE | ORAL | 0 refills | Status: DC
Start: 2021-01-08 — End: 2021-01-29

## 2021-01-08 MED ORDER — METFORMIN HCL 500 MG PO TABS: ORAL_TABLET | ORAL | 0 refills | Status: DC

## 2021-01-08 NOTE — Patient Instructions (Signed)
The 10-year ASCVD risk score Mikey Bussing DC Brooke Bonito., et al., 2013) is: 1.2%   Values used to calculate the score:     Age: 55 years     Sex: Female     Is Non-Hispanic African American: No     Diabetic: No     Tobacco smoker: No     Systolic Blood Pressure: 644 mmHg     Is BP treated: No     HDL Cholesterol: 65 mg/dL     Total Cholesterol: 213 mg/dL

## 2021-01-20 NOTE — Progress Notes (Signed)
Chief Complaint:   OBESITY Kara King is here to discuss her progress with her obesity treatment plan along with follow-up of her obesity related diagnoses.   Today's visit was #: 8 Starting weight: 240 lbs Starting date: 08/08/2020 Today's weight: 209 lbs Today's date: 01/08/2021 Weight change since last visit: 0 Total lbs lost to date: 31 lbs Body mass index is 34.78 kg/m.  Total weight loss percentage to date: -12.92%  Interim History:  Kara King has been doing a lot of celebration eating lately.  Happy she maintained because she drank and ate off plan.  At the beach she ate a donut.  No issues with plan.  She has been back on track over the last day or so.  Plan:  Refill metformin (since she recently increased Topamax to twice daily,we will hold off on increasing metformin for now).  Current Meal Plan: the Category 2 Plan +100 calories for 80% of the time.  Current Exercise Plan: Females in Action for 45 minutes 4-5 times per week.  Assessment/Plan:   Medications Discontinued During This Encounter  Medication Reason   Vitamin D, Ergocalciferol, (DRISDOL) 1.25 MG (50000 UNIT) CAPS capsule Reorder   metFORMIN (GLUCOPHAGE) 500 MG tablet Reorder   Meds ordered this encounter  Medications   Vitamin D, Ergocalciferol, (DRISDOL) 1.25 MG (50000 UNIT) CAPS capsule    Sig: Take 1 capsule (50,000 Units total) by mouth every 7 (seven) days.    Dispense:  4 capsule    Refill:  0    Ov for rf   metFORMIN (GLUCOPHAGE) 500 MG tablet    Sig: 1 po qd with lunch    Dispense:  30 tablet    Refill:  0    Ov for RF    1. Vitamin D deficiency At goal.  She is taking vitamin D 50,000 IU weekly.  Labs within normal limits.  Increased from prior and at goal now.  Plan:  Discussed labs with patient today.  Continue to take prescription Vitamin D @50 ,000 IU every week as prescribed.  Follow-up for routine testing of Vitamin D, at least 2-3 times per year to avoid over-replacement.  Lab  Results  Component Value Date   VD25OH 55.6 12/18/2020   VD25OH 21.9 (L) 08/08/2020   - Refill Vitamin D, Ergocalciferol, (DRISDOL) 1.25 MG (50000 UNIT) CAPS capsule; Take 1 capsule (50,000 Units total) by mouth every 7 (seven) days.  Dispense: 4 capsule; Refill: 0  2. Other hyperlipidemia Course: Not at goal. Lipid-lowering medications: None.    Plan:  Discussed labs with patient today.  No change from prior.  No need for medications, but with family history, she will discuss with PCP if appropriate.  Dietary changes: Increase soluble fiber, decrease simple carbohydrates, decrease saturated fat. Exercise changes: Moderate to vigorous-intensity aerobic activity 150 minutes per week or as tolerated. We will continue to monitor along with PCP/specialists as it pertains to her weight loss journey.  Lab Results  Component Value Date   CHOL 213 (H) 12/18/2020   HDL 65 12/18/2020   LDLCALC 136 (H) 12/18/2020   TRIG 69 12/18/2020   CHOLHDL 3.3 12/18/2020   Lab Results  Component Value Date   ALT 18 12/18/2020   AST 23 12/18/2020   ALKPHOS 81 12/18/2020   BILITOT 0.4 12/18/2020   The 10-year ASCVD risk score Kara Bussing DC Jr., et al., 2013) is: 1.2%   Values used to calculate the score:     Age: 55 years  Sex: Female     Is Non-Hispanic African American: No     Diabetic: No     Tobacco smoker: No     Systolic Blood Pressure: 277 mmHg     Is BP treated: No     HDL Cholesterol: 65 mg/dL     Total Cholesterol: 213 mg/dL  3. Hypernatremia Kara King eats very little salt and drinks 1 gallon of water per day, but exercises outside and sweats a lot.    Plan:  Discussed labs with patient today.  CMP largely unchanged.  Increase water intake.  Watch salt in foods.  Continue to monitor.  4. At risk for dehydration Kara King is at higher than average risk of dehydration.  Kara King was given more than 9 minutes of proper hydration counseling today.  We discussed the signs and symptoms of  dehydration, some of which may include muscle cramping, constipation or even orthostatic symptoms.  Counseling on the prevention of dehydration was also provided today.  Kara King is at risk for dehydration due to weight loss, lifestyle and behavorial habits and possibly due to taking certain medication(s).  She was encouraged to adequately hydrate and monitor fluid status to avoid dehydration as well as weight loss plateaus.  Unless pre-existing renal or cardiopulmonary conditions exist, in which patient was told to limit their fluid intake, I recommended roughly one half of their weight in pounds to be the approximate ounces of non-caloric, non-caffeinated beverages they should drink per day; including more if they are engaging in exercise.  5. Obesity, current BMI 36.1  - metFORMIN (GLUCOPHAGE) 500 MG tablet; 1 po qd with lunch  Dispense: 30 tablet; Refill: 0  Course: Kara King is currently in the action stage of change. As such, her goal is to continue with weight loss efforts.   Nutrition goals: She has agreed to the Category 2 Plan +100 calories.   Exercise goals:  As is.  Behavioral modification strategies: increasing water intake, better snacking choices, emotional eating strategies, avoiding temptations, and planning for success.  Kara King has agreed to follow-up with our clinic in 3 weeks. She was informed of the importance of frequent follow-up visits to maximize her success with intensive lifestyle modifications for her multiple health conditions.   Objective:   Blood pressure 103/64, pulse 66, temperature 98.1 F (36.7 C), height 5\' 5"  (1.651 m), weight 209 lb (94.8 kg), last menstrual period 09/30/2016, SpO2 97 %. Body mass index is 34.78 kg/m.  General: Cooperative, alert, well developed, in no acute distress. HEENT: Conjunctivae and lids unremarkable. Cardiovascular: Regular rhythm.  Lungs: Normal work of breathing. Neurologic: No focal deficits.   Lab Results   Component Value Date   CREATININE 0.75 12/18/2020   BUN 16 12/18/2020   NA 146 (H) 12/18/2020   K 4.4 12/18/2020   CL 110 (H) 12/18/2020   CO2 22 12/18/2020   Lab Results  Component Value Date   ALT 18 12/18/2020   AST 23 12/18/2020   ALKPHOS 81 12/18/2020   BILITOT 0.4 12/18/2020   Lab Results  Component Value Date   HGBA1C 5.6 08/08/2020   Lab Results  Component Value Date   INSULIN 4.5 08/08/2020   Lab Results  Component Value Date   TSH 1.450 08/08/2020   Lab Results  Component Value Date   CHOL 213 (H) 12/18/2020   HDL 65 12/18/2020   LDLCALC 136 (H) 12/18/2020   TRIG 69 12/18/2020   CHOLHDL 3.3 12/18/2020   Lab Results  Component Value Date  VD25OH 55.6 12/18/2020   VD25OH 21.9 (L) 08/08/2020   Lab Results  Component Value Date   WBC 4.9 08/08/2020   HGB 14.0 08/08/2020   HCT 43.8 08/08/2020   MCV 91 08/08/2020   PLT 268 08/08/2020   Attestation Statements:   Reviewed by clinician on day of visit: allergies, medications, problem list, medical history, surgical history, family history, social history, and previous encounter notes.  I, Water quality scientist, CMA, am acting as Location manager for [employer], DO.  I have reviewed the above documentation for accuracy and completeness, and I agree with the above. Marjory Sneddon, D.O.  The [place] was signed into law in 2016 which includes the topic of electronic health records.  This provides immediate access to information in MyChart.  This includes consultation notes, operative notes, office notes, lab results and pathology reports.  If you have any questions about what you read please let us know at your next visit so we can discuss your concerns and take corrective action if need be.  We are right here with you.

## 2021-01-28 ENCOUNTER — Encounter: Payer: Self-pay | Admitting: Family Medicine

## 2021-01-28 ENCOUNTER — Other Ambulatory Visit: Payer: Self-pay

## 2021-01-28 ENCOUNTER — Ambulatory Visit (INDEPENDENT_AMBULATORY_CARE_PROVIDER_SITE_OTHER): Payer: 59 | Admitting: Family Medicine

## 2021-01-28 VITALS — BP 110/70 | HR 46 | Temp 98.2°F | Resp 18 | Ht 65.0 in | Wt 215.2 lb

## 2021-01-28 DIAGNOSIS — E785 Hyperlipidemia, unspecified: Secondary | ICD-10-CM | POA: Diagnosis not present

## 2021-01-28 DIAGNOSIS — R232 Flushing: Secondary | ICD-10-CM

## 2021-01-28 DIAGNOSIS — F320 Major depressive disorder, single episode, mild: Secondary | ICD-10-CM | POA: Diagnosis not present

## 2021-01-28 DIAGNOSIS — Z23 Encounter for immunization: Secondary | ICD-10-CM

## 2021-01-28 DIAGNOSIS — Z0000 Encounter for general adult medical examination without abnormal findings: Secondary | ICD-10-CM | POA: Insufficient documentation

## 2021-01-28 DIAGNOSIS — Z1211 Encounter for screening for malignant neoplasm of colon: Secondary | ICD-10-CM

## 2021-01-28 DIAGNOSIS — E7849 Other hyperlipidemia: Secondary | ICD-10-CM

## 2021-01-28 MED ORDER — GABAPENTIN 100 MG PO CAPS: 100.0000 mg | ORAL_CAPSULE | Freq: Two times a day (BID) | ORAL | 2 refills | Status: DC

## 2021-01-28 NOTE — Assessment & Plan Note (Signed)
Encourage heart healthy diet such as MIND or DASH diet, increase exercise, avoid trans fats, simple carbohydrates and processed foods, consider a krill or fish or flaxseed oil cap daily.  °

## 2021-01-28 NOTE — Assessment & Plan Note (Signed)
stable °

## 2021-01-28 NOTE — Progress Notes (Signed)
Subjective:   By signing my name below, I, Shehryar Baig, attest that this documentation has been prepared under the direction and in the presence of Dr. Roma Schanz, DO. 01/28/2021   Patient ID: Kara King, female    DOB: Mar 10, 1966, 55 y.o.   MRN: [id number]  Chief Complaint  Patient presents with   Annual Exam    Pt states fasting     HPI Patient is in today for a comprehensive physical exam.  She complains of symptoms from menopause that worsens at night. She plans on bringing up these issues with her GYN to help manage her symptoms. She also notes that she is losing hair on her armpits and head.  She continues to using a healthy weight and wellness program. She reports participating in regular exercise but she does not follow her diet consistently. She has lost 30 lb since starting the program. During her last lab screening she found her LDL was elevated. Her brother has a history of hyperlipidemia.   Lab Results  Component Value Date   CHOL 213 (H) 12/18/2020   HDL 65 12/18/2020   LDLCALC 136 (H) 12/18/2020   TRIG 69 12/18/2020   CHOLHDL 3.3 12/18/2020   Last Weight  Most recent update: 01/28/2021  9:09 AM    Weight  97.6 kg (215 lb 3.2 oz)            She denies having any fever, joint pain, ear pain, congestion, sinus pain, sore throat, eye pain, chest pain, palpations, cough, SOB, wheezing, n/v/d, constipation, blood in stool, dysuria, frequency, hematuria, or headaches at this time. She has no recent change in her family history. She is not interested in a HIV screening at this time. She is willing to get hepatitis C screening during her next lab screening. She is due for a tetanus vaccine and is interested in getting is during this visit. She as not yet completed a colonoscopy and is hesitant to set up an appointment because of a bad experience while under anesthesia during her tubal litigation procedure in May, 2001. She gets her mammograms completed at  [facility] [place], [place]. She has 2 moderna Covid-19 vaccines at this time. She is UTD on shingles vaccines at this time.   Past Medical History:  Diagnosis Date   Allergy    Asthma    Constipation    Depression    Migraine headache    SOBOE (shortness of breath on exertion)    Swelling of both lower extremities    Varicose veins    Vitamin D deficiency     Past Surgical History:  Procedure Laterality Date   G 3 P 3     TUBAL LIGATION      Family History  Adopted: Yes  Problem Relation Age of Onset   Alcohol abuse Father    Heart attack Brother 15        smoker   Heart disease Brother    Hypertension Brother    Uterine cancer Maternal Grandmother    Cancer Maternal Grandmother    Stroke Paternal Grandmother        > 35   Diabetes Paternal Grandmother     Social History   Socioeconomic History   Marital status: Single    Spouse name: Not on file   Number of children: 3   Years of education: Not on file   Highest education level: Not on file  Occupational History   Occupation: Glass blower/designer &  sales    Comment: [employer]  Tobacco Use   Smoking status: Former    Packs/day: 2.00    Types: Cigarettes    Quit date: 01/03/2011    Years since quitting: 10.0   Smokeless tobacco: Never   Tobacco comments:    smoked age [age] (not with pregnancies , up to 2 ppd  Vaping Use   Vaping Use: Never used  Substance and Sexual Activity   Alcohol use: No    Alcohol/week: 0.0 standard drinks   Drug use: No   Sexual activity: Never    Partners: Female  Other Topics Concern   Not on file  Social History Narrative   Not on file   Social Determinants of Health   Financial Resource Strain: Not on file  Food Insecurity: Not on file  Transportation Needs: Not on file  Physical Activity: Not on file  Stress: Not on file  Social Connections: Not on file  Intimate Partner Violence: Not on file    Outpatient Medications Prior to Visit  Medication  Sig Dispense Refill   albuterol (VENTOLIN HFA) 108 (90 Base) MCG/ACT inhaler Inhale 2 puffs into the lungs every 6 (six) hours as needed for shortness of breath (cough). 1 each 0   GLUCOSAMINE-CHONDROITIN PO Take 1 capsule by mouth 2 (two) times daily.     metFORMIN (GLUCOPHAGE) 500 MG tablet 1 po qd with lunch 30 tablet 0   Multiple Vitamin (MULTIVITAMIN) tablet Take 1 tablet by mouth daily.     NONFORMULARY OR COMPOUNDED ITEM Compression stocking 20-52m/hg #1  As directed 1 each 0   rizatriptan (MAXALT) 10 MG tablet TAKE 1 TABLET BY MOUTH ONCE DAILY AS NEEDED FOR MIGRAINE HEADACHE 12 tablet 0   topiramate (TOPAMAX) 50 MG tablet Take 1 tablet by mouth twice daily 180 tablet 0   UNABLE TO FIND Med Name: Ionic Trace Minerals:  Age Immune     UNABLE TO FIND Med Name:  Fulvic Humic Acid:  6 drops daily (joint health)     Vitamin D, Ergocalciferol, (DRISDOL) 1.25 MG (50000 UNIT) CAPS capsule Take 1 capsule (50,000 Units total) by mouth every 7 (seven) days. 4 capsule 0   No facility-administered medications prior to visit.    Allergies  Allergen Reactions   Wellbutrin [Bupropion]     01/30/13 dyspnea (MyChart message)   Effexor [Venlafaxine]     "Confusion"    Review of Systems  Constitutional:  Negative for fever.  HENT:  Negative for congestion, ear pain, sinus pain and sore throat.   Eyes:  Negative for pain.  Respiratory:  Negative for cough, shortness of breath and wheezing.   Cardiovascular:  Negative for chest pain and palpitations.  Gastrointestinal:  Negative for blood in stool, constipation, diarrhea, nausea and vomiting.  Genitourinary:  Negative for dysuria, frequency and hematuria.  Musculoskeletal:  Negative for joint pain.  Neurological:  Negative for headaches.  Psychiatric/Behavioral:  Negative for depression. The patient is not nervous/anxious.       Objective:    Physical Exam Constitutional:      General: She is not in acute distress.    Appearance: Normal  appearance. She is not ill-appearing.  HENT:     Head: Normocephalic and atraumatic.     Right Ear: Tympanic membrane, ear canal and external ear normal.     Left Ear: Tympanic membrane, ear canal and external ear normal.  Eyes:     Extraocular Movements: Extraocular movements intact.     Pupils: Pupils  are equal, round, and reactive to light.  Cardiovascular:     Rate and Rhythm: Normal rate and regular rhythm.     Pulses: Normal pulses.     Heart sounds: Normal heart sounds. No murmur heard.   No gallop.  Pulmonary:     Effort: Pulmonary effort is normal. No respiratory distress.     Breath sounds: Normal breath sounds. No wheezing, rhonchi or rales.  Abdominal:     General: Bowel sounds are normal. There is no distension.     Palpations: Abdomen is soft. There is no mass.     Tenderness: There is no abdominal tenderness. There is no guarding or rebound.     Hernia: No hernia is present.  Skin:    General: Skin is warm and dry.  Neurological:     Mental Status: She is alert and oriented to person, place, and time.  Psychiatric:        Behavior: Behavior normal.    BP 110/70 (BP Location: Right Arm, Patient Position: Sitting, Cuff Size: Normal)   Pulse (!) 46   Temp 98.2 F (36.8 C) (Oral)   Resp 18   Ht _0  (1.651 m)   Wt 215 lb 3.2 oz (97.6 kg)   LMP 09/30/2016   SpO2 97%   BMI 35.81 kg/m  Wt Readings from Last 3 Encounters:  01/28/21 215 lb 3.2 oz (97.6 kg)  01/08/21 209 lb (94.8 kg)  12/18/20 209 lb (94.8 kg)    Diabetic Foot Exam - Simple   No data filed    Lab Results  Component Value Date   WBC 4.9 08/08/2020   HGB 14.0 08/08/2020   HCT 43.8 08/08/2020   PLT 268 08/08/2020   GLUCOSE 86 12/18/2020   CHOL 213 (H) 12/18/2020   TRIG 69 12/18/2020   HDL 65 12/18/2020   LDLCALC 136 (H) 12/18/2020   ALT 18 12/18/2020   AST 23 12/18/2020   NA 146 (H) 12/18/2020   K 4.4 12/18/2020   CL 110 (H) 12/18/2020   CREATININE 0.75 12/18/2020   BUN 16  12/18/2020   CO2 22 12/18/2020   TSH 1.450 08/08/2020   HGBA1C 5.6 08/08/2020    Lab Results  Component Value Date   TSH 1.450 08/08/2020   Lab Results  Component Value Date   WBC 4.9 08/08/2020   HGB 14.0 08/08/2020   HCT 43.8 08/08/2020   MCV 91 08/08/2020   PLT 268 08/08/2020   Lab Results  Component Value Date   NA 146 (H) 12/18/2020   K 4.4 12/18/2020   CO2 22 12/18/2020   GLUCOSE 86 12/18/2020   BUN 16 12/18/2020   CREATININE 0.75 12/18/2020   BILITOT 0.4 12/18/2020   ALKPHOS 81 12/18/2020   AST 23 12/18/2020   ALT 18 12/18/2020   PROT 6.2 12/18/2020   ALBUMIN 4.4 12/18/2020   CALCIUM 9.5 12/18/2020   EGFR 95 12/18/2020   GFR 94.67 07/30/2020   Lab Results  Component Value Date   CHOL 213 (H) 12/18/2020   Lab Results  Component Value Date   HDL 65 12/18/2020   Lab Results  Component Value Date   LDLCALC 136 (H) 12/18/2020   Lab Results  Component Value Date   TRIG 69 12/18/2020   Lab Results  Component Value Date   CHOLHDL 3.3 12/18/2020   Lab Results  Component Value Date   HGBA1C 5.6 08/08/2020   Mammogram- Last completed 08/14/2015. (Due) Colonoscopy- Not yet completed. (Due) Pap smear-  Last completed 08/14/2015. Results normal. Repeat in 3 years. (Due)     Assessment & Plan:   Problem List Items Addressed This Visit       Unprioritized   Depression, major, single episode, mild ([place])    stable       Other hyperlipidemia    Encourage heart healthy diet such as MIND or DASH diet, increase exercise, avoid trans fats, simple carbohydrates and processed foods, consider a krill or fish or flaxseed oil cap daily.        Preventative health care - Primary    ghm utd Check labs        Other Visit Diagnoses     Need for Tdap vaccination       Relevant Orders   Tdap vaccine greater than or equal to 7yo IM (Completed)   Colon cancer screening       Relevant Orders   Ambulatory referral to Gastroenterology   Hyperlipidemia,  unspecified hyperlipidemia type       Hot flashes       Relevant Medications   gabapentin (NEURONTIN) 100 MG capsule        Meds ordered this encounter  Medications   gabapentin (NEURONTIN) 100 MG capsule    Sig: Take 1 capsule (100 mg total) by mouth 2 (two) times daily.    Dispense:  60 capsule    Refill:  2    I, Dr. Roma Schanz, DO, personally preformed the services described in this documentation.  All medical record entries made by the scribe were at my direction and in my presence.  I have reviewed the chart and discharge instructions (if applicable) and agree that the record reflects my personal performance and is accurate and complete. 01/28/2021   I,Shehryar Baig,acting as a Education administrator for [employer], DO.,have documented all relevant documentation on the behalf of Ann Held, DO,as directed by  Ann Held, DO while in the presence of Ann Held, DO.   Ann Held, DO

## 2021-01-28 NOTE — Patient Instructions (Signed)
Preventive Care 55-55 Years Old, Female Preventive care refers to lifestyle choices and visits with your health care provider that can promote health and wellness. This includes: A yearly physical exam. This is also called an annual wellness visit. Regular dental and eye exams. Immunizations. Screening for certain conditions. Healthy lifestyle choices, such as: Eating a healthy diet. Getting regular exercise. Not using drugs or products that contain nicotine and tobacco. Limiting alcohol use. What can I expect for my preventive care visit? Physical exam Your health care provider will check your: Height and weight. These may be used to calculate your BMI (body mass index). BMI is a measurement that tells if you are at a healthy weight. Heart rate and blood pressure. Body temperature. Skin for abnormal spots. Counseling Your health care provider may ask you questions about your: Past medical problems. Family's medical history. Alcohol, tobacco, and drug use. Emotional well-being. Home life and relationship well-being. Sexual activity. Diet, exercise, and sleep habits. Work and work Statistician. Access to firearms. Method of birth control. Menstrual cycle. Pregnancy history. What immunizations do I need?  Vaccines are usually given at various ages, according to a schedule. Your health care provider will recommend vaccines for you based on your age, medicalhistory, and lifestyle or other factors, such as travel or where you work. What tests do I need? Blood tests Lipid and cholesterol levels. These may be checked every 5 years, or more often if you are over 55 years old. Hepatitis C test. Hepatitis B test. Screening Lung cancer screening. You may have this screening every year starting at age 30 if you have a 30-pack-year history of smoking and currently smoke or have quit within the past 15 years. Colorectal cancer screening. All adults should have this screening starting at  age 55 and continuing until age 3. Your health care provider may recommend screening at age 55 if you are at increased risk. You will have tests every 1-10 years, depending on your results and the type of screening test. Diabetes screening. This is done by checking your blood sugar (glucose) after you have not eaten for a while (fasting). You may have this done every 1-3 years. Mammogram. This may be done every 1-2 years. Talk with your health care provider about when you should start having regular mammograms. This may depend on whether you have a family history of breast cancer. BRCA-related cancer screening. This may be done if you have a family history of breast, ovarian, tubal, or peritoneal cancers. Pelvic exam and Pap test. This may be done every 3 years starting at age 55. Starting at age 54, this may be done every 5 years if you have a Pap test in combination with an HPV test. Other tests STD (sexually transmitted disease) testing, if you are at risk. Bone density scan. This is done to screen for osteoporosis. You may have this scan if you are at high risk for osteoporosis. Talk with your health care provider about your test results, treatment options,and if necessary, the need for more tests. Follow these instructions at home: Eating and drinking  Eat a diet that includes fresh fruits and vegetables, whole grains, lean protein, and low-fat dairy products. Take vitamin and mineral supplements as recommended by your health care provider. Do not drink alcohol if: Your health care provider tells you not to drink. You are pregnant, may be pregnant, or are planning to become pregnant. If you drink alcohol: Limit how much you have to 0-1 drink a day. Be aware  of how much alcohol is in your drink. In the [place] one drink equals one 12 oz bottle of beer (355 mL), one 5 oz glass of wine (148 mL), or one 1 oz glass of hard liquor (44 mL).  Lifestyle Take daily care of your teeth and  gums. Brush your teeth every morning and night with fluoride toothpaste. Floss one time each day. Stay active. Exercise for at least 30 minutes 5 or more days each week. Do not use any products that contain nicotine or tobacco, such as cigarettes, e-cigarettes, and chewing tobacco. If you need help quitting, ask your health care provider. Do not use drugs. If you are sexually active, practice safe sex. Use a condom or other form of protection to prevent STIs (sexually transmitted infections). If you do not wish to become pregnant, use a form of birth control. If you plan to become pregnant, see your health care provider for a prepregnancy visit. If told by your health care provider, take low-dose aspirin daily starting at age 29. Find healthy ways to cope with stress, such as: Meditation, yoga, or listening to music. Journaling. Talking to a trusted person. Spending time with friends and family. Safety Always wear your seat belt while driving or riding in a vehicle. Do not drive: If you have been drinking alcohol. Do not ride with someone who has been drinking. When you are tired or distracted. While texting. Wear a helmet and other protective equipment during sports activities. If you have firearms in your house, make sure you follow all gun safety procedures. What's next? Visit your health care provider once a year for an annual wellness visit. Ask your health care provider how often you should have your eyes and teeth checked. Stay up to date on all vaccines. This information is not intended to replace advice given to you by your health care provider. Make sure you discuss any questions you have with your healthcare provider. Document Revised: 04/02/2020 Document Reviewed: 03/10/2018 Elsevier Patient Education  2022 [employer].

## 2021-01-28 NOTE — Assessment & Plan Note (Signed)
ghm utd Check labs  

## 2021-01-29 ENCOUNTER — Encounter (INDEPENDENT_AMBULATORY_CARE_PROVIDER_SITE_OTHER): Payer: Self-pay | Admitting: Family Medicine

## 2021-01-29 ENCOUNTER — Ambulatory Visit (INDEPENDENT_AMBULATORY_CARE_PROVIDER_SITE_OTHER): Payer: 59 | Admitting: Family Medicine

## 2021-01-29 VITALS — BP 119/74 | HR 45 | Temp 98.2°F | Ht 65.0 in | Wt 208.0 lb

## 2021-01-29 DIAGNOSIS — Z9189 Other specified personal risk factors, not elsewhere classified: Secondary | ICD-10-CM | POA: Diagnosis not present

## 2021-01-29 DIAGNOSIS — E559 Vitamin D deficiency, unspecified: Secondary | ICD-10-CM

## 2021-01-29 DIAGNOSIS — Z6839 Body mass index (BMI) 39.0-39.9, adult: Secondary | ICD-10-CM | POA: Diagnosis not present

## 2021-01-29 DIAGNOSIS — E66812 Obesity, class 2: Secondary | ICD-10-CM

## 2021-01-29 DIAGNOSIS — F32A Depression, unspecified: Secondary | ICD-10-CM | POA: Diagnosis not present

## 2021-01-29 MED ORDER — METFORMIN HCL 500 MG PO TABS: ORAL_TABLET | ORAL | 0 refills | Status: DC

## 2021-01-29 MED ORDER — VITAMIN D (ERGOCALCIFEROL) 1.25 MG (50000 UNIT) PO CAPS: 50000.0000 [IU] | ORAL_CAPSULE | ORAL | 0 refills | Status: DC

## 2021-01-29 NOTE — Progress Notes (Signed)
Office: [phone]  /  Fax: [phone]    Date: February 04, 2021   Appointment Start Time: 12:00pm Duration: 31 minutes Provider: Glennie Isle, Psy.D. Type of Session: Intake for Individual Therapy  Location of Patient: Work (private location) Location of Provider: Provider's home (private office) Type of Contact: Telepsychological Visit via MyChart Video Visit  Informed Consent: Prior to proceeding with today's appointment, two pieces of identifying information were obtained. In addition, Kara King's physical location at the time of this appointment was obtained as well a phone number she could be reached at in the event of technical difficulties. Kara King and this provider participated in today's telepsychological service. Of note, today's appointment was switched to a regular telephone call at 12:03pm with Kara King's verbal consent due to technical issues.   The provider's role was explained to Kara King. The provider reviewed and discussed issues of confidentiality, privacy, and limits therein (e.g., reporting obligations). In addition to verbal informed consent, written informed consent for psychological services was obtained prior to the initial appointment. Since the clinic is not a 24/7 crisis center, mental health emergency resources were shared and this  provider explained MyChart, e-mail, voicemail, and/or other messaging systems should be utilized only for non-emergency reasons. This provider also explained that information obtained during appointments will be placed in Kara King's medical record and relevant information will be shared with other providers at [facility] for coordination of care. Kara King agreed information may be shared with other [facility] providers as needed for coordination of care and by signing the service agreement document, she provided written consent for coordination of care. Prior to initiating telepsychological  services, Kara King completed an informed consent document, which included the development of a safety plan (i.e., an emergency contact and emergency resources) in the event of an emergency/crisis. Kara King verbally acknowledged understanding she is ultimately responsible for understanding her insurance benefits for telepsychological and in-person services. This provider also reviewed confidentiality, as it relates to telepsychological services, as well as the rationale for telepsychological services (i.e., to reduce exposure risk to COVID-19). Kara King  acknowledged understanding that appointments cannot be recorded without both party consent and she is aware she is responsible for securing confidentiality on her end of the session. Kara King verbally consented to proceed.  Chief Complaint/HPI: Kara King was referred by Kara King due to  unspecified depression, emotional eating  on January 29, 2021. The note for the initial appointment with Kara King on August 08, 2020 indicated the following: "Her family eats meals together, she thinks her family will eat healthier with her, her desired weight loss is 75 lbs, she has been heavy most of her life, she started gaining weight after a death in her family, her heaviest weight ever was 240 pounds, she has significant food cravings issues, she skips meals frequently, she is frequently drinking liquids with calories, she frequently makes poor food choices, she frequently eats larger portions than normal and she struggles with emotional eating." Kara King's Food and Mood (modified PHQ-9) score on August 08, 2020 was 15.  During today's appointment, Kara King was verbally administered a questionnaire assessing various behaviors related to emotional eating behaviors. Kara King endorsed the following: overeat when you are celebrating, experience food cravings on a regular basis, eat certain foods when you are anxious, stressed, depressed, or your  feelings are hurt, use food to help you cope with emotional situations, find food is comforting to you, overeat when you are angry or upset, overeat when you are worried  about something, overeat frequently when you are bored or lonely, overeat when you are angry at someone just to show them they cannot control you, and eat as a reward. Kara King believes the onset of emotional eating behaviors was likely in childhood, and described the current frequency of emotional eating behaviors as "few times a week." In addition, Kara King endorsed a history of binge eating behaviors. She explained she has not engaged in binge eating behaviors "in several months" due to the structured meal plan. When she deviates from the structured meal plan, she noted she will eat "too much of the wrong stuff." She acknowledged experience all or nothing thinking. Kara King denied a history of restricting food intake, purging and engagement in other compensatory strategies, and has never been diagnosed with an eating disorder. She also denied a history of treatment for emotional eating. Currently, Kara King indicated loneliness, hurt feeling, and relationships triggers emotional eating behaviors, whereas not having access to certain foods makes emotional eating behaviors better. Furthermore, Kara King described herself  as "very regimented" and is able to follow the structured meal plan as long as she does not "waiver."   Mental Status Examination:  Appearance: well groomed and appropriate hygiene  Behavior: appropriate to circumstances Mood: euthymic Affect: mood congruent Speech: normal in rate, volume, and tone Eye Contact: appropriate Psychomotor Activity: unable to assess Gait: unable to assess  Thought Process: linear, logical, and goal directed  Thought Content/Perception: denies suicidal and homicidal ideation, plan, and intent, no hallucinations, delusions, bizarre thinking or behavior reported or observed, and denies  ideation and engagement in self-injurious behaviors Orientation: time, person, place, and purpose of appointment Memory/Concentration: memory, attention, language, and fund of knowledge intact  Insight/Judgment: good  Family & Psychosocial History: Kara King reported she is not in a relationship and she has three children (ages 55, 31, and 61). She indicated she is currently employed at [employer] as a Radiation protection practitioner. Additionally, Kara King shared her highest level of education obtained is a Buyer, retail degree in social work. Currently, Kara King's social support system consists of her friends and children. Moreover, Kara King stated her son (age 15) lives with her.    Medical History:  Past Medical History:  Diagnosis Date   Allergy    Asthma    Constipation    Depression    Migraine headache    SOBOE (shortness of breath on exertion)    Swelling of both lower extremities    Varicose veins    Vitamin D deficiency    Past Surgical History:  Procedure Laterality Date   G 3 P 3     TUBAL LIGATION     Current Outpatient Medications on File Prior to Visit  Medication Sig Dispense Refill   albuterol (VENTOLIN HFA) 108 (90 Base) MCG/ACT inhaler Inhale 2 puffs into the lungs every 6 (six) hours as needed for shortness of breath (cough). 1 each 0   gabapentin (NEURONTIN) 100 MG capsule Take 1 capsule (100 mg total) by mouth 2 (two) times daily. 60 capsule 2   GLUCOSAMINE-CHONDROITIN PO Take 1 capsule by mouth 2 (two) times daily.     metFORMIN (GLUCOPHAGE) 500 MG tablet 1 po qd with lunch 30 tablet 0   Multiple Vitamin (MULTIVITAMIN) tablet Take 1 tablet by mouth daily.     NONFORMULARY OR COMPOUNDED ITEM Compression stocking 20-45mm/hg #1  As directed 1 each 0   rizatriptan (MAXALT) 10 MG tablet TAKE 1 TABLET BY MOUTH ONCE DAILY AS NEEDED FOR MIGRAINE HEADACHE 12 tablet 0  topiramate (TOPAMAX) 50 MG tablet Take 1 tablet by mouth twice daily 180 tablet 0   UNABLE TO FIND Med Name: Ionic  Trace Minerals:  Age Immune     UNABLE TO FIND Med Name:  Fulvic Humic Acid:  6 drops daily (joint health)     Vitamin D, Ergocalciferol, (DRISDOL) 1.25 MG (50000 UNIT) CAPS capsule Take 1 capsule (50,000 Units total) by mouth every 7 (seven) days. 4 capsule 0   No current facility-administered medications on file prior to visit.  Medication compliant.   Mental Health History: Kara King reported she previously attended therapeutic services due to relationship issues, divorce,  and the sudden passing of her ex-husband. She last attended therapeutic services a couple months ago with Kara King, noting a plan to "go back." Kara King explained she last sought services to address ongoing stressors. She endorsed she was previously prescribed an antidepressant, noting she is currently not taking any psychotropic medications. Kara King reported there is no history of hospitalizations for psychiatric concerns. She endorsed a family history of alcohol abuse (father, paternal grandfather, and maternal grandfather) and suspects depression. Kattaleya reported there is no history of trauma including psychological, physical , and sexual abuse, as well as neglect. However, she noted, "My childhood was not perfect," adding "I had an alcoholic father that was violent." Rosaleah indicated her parents were divorced and he would "stay away for the most part." If he "showed up drunk," law enforcement was reportedly contacted. Her father is deceased.   Kara King described her typical mood lately as "moody." She indicated she has an appointment with her gynecologist, as she feels it is due to hormones. Suhaila endorsed very infrequent alcohol use ("Couple times a year."). She denied tobacco use. She denied illicit/recreational substance use. Regarding caffeine intake, Pamala reported consuming 16oz of coffee daily. Furthermore, Snow indicated she is not experiencing the following: hallucinations and delusions,  paranoia, symptoms of mania , social withdrawal, crying spells, panic attacks, and symptoms of trauma. She also denied history of and current suicidal ideation, plan, and intent; history of and current homicidal ideation, plan, and intent; and history of and current engagement in self-King.  The following strengths were reported by Kara King: consistent, reasonable, and calm. The following strengths were observed by this provider: ability to express thoughts and feelings during the therapeutic session, ability to establish and benefit from a therapeutic relationship, willingness to work toward established goal(s) with the clinic and ability to engage in reciprocal conversation.   Legal History: Banessa reported there is no history of legal involvement.   Structured Assessments Results: The Patient Health Questionnaire-9 (PHQ-9) is a self-report measure that assesses symptoms and severity of depression over the course of the last two weeks. Daksha obtained a score of 0. [0= Not at all; 1= Several days; 2= More than half the days; 3= Nearly every day] Little interest or pleasure in doing things 0  Feeling down, depressed, or hopeless 0  Trouble falling or staying asleep, or sleeping too much 0  Feeling tired or having little energy 0  Poor appetite or overeating 0  Feeling bad about yourself --- or that you are a failure or have let yourself or your family down 0  Trouble concentrating on things, such as reading the newspaper or watching television 0  Moving or speaking so slowly that other people could have noticed? Or the opposite --- being so fidgety or restless that you have been moving around a lot more than usual 0  Thoughts that you would  be better off dead or hurting yourself in some way 0  PHQ-9 Score 0    The Generalized Anxiety Disorder-7 (GAD-7) is a brief self-report measure that assesses symptoms of anxiety over the course of the last two weeks. Anshu obtained a score of  0. [0= Not at all; 1= Several days; 2= Over half the days; 3= Nearly every day] Feeling nervous, anxious, on edge 0  Not being able to stop or control worrying 0  Worrying too much about different things 0  Trouble relaxing 0  Being so restless that it's hard to sit still 0  Becoming easily annoyed or irritable 0  Feeling afraid as if something awful might happen 0  GAD-7 Score 0   Interventions:  Conducted a chart review Focused on rapport building Verbally administered PHQ-9 and GAD-7 for symptom monitoring Verbally administered Food & Mood questionnaire to assess various behaviors related to emotional eating Provided emphatic reflections and validation Collaborated with patient on a treatment goal  Psychoeducation provided regarding physical versus emotional hunger  Provisional DSM-5 Diagnosis(es): F50.89 Other Specified Feeding or Eating Disorder, Emotional Eating Behaviors  Plan: Karinna appears able and willing to participate as evidenced by collaboration on a treatment goal, engagement in reciprocal conversation, and asking questions as needed for clarification. The next appointment will be scheduled in three weeks, which will be via MyChart Video Visit. The following treatment goal was established: increase coping skills. This provider will regularly review the treatment plan and medical chart to keep informed of status changes. Tache expressed understanding and agreement with the initial treatment plan of care. Cecelia will be sent a handout via e-mail to utilize between now and the next appointment to increase awareness of hunger patterns and subsequent eating. Kawthar provided verbal consent during today's appointment for this provider to send the handout via e-mail.

## 2021-02-04 ENCOUNTER — Telehealth (INDEPENDENT_AMBULATORY_CARE_PROVIDER_SITE_OTHER): Payer: 59 | Admitting: Psychology

## 2021-02-04 DIAGNOSIS — F5089 Other specified eating disorder: Secondary | ICD-10-CM

## 2021-02-06 ENCOUNTER — Other Ambulatory Visit: Payer: Self-pay | Admitting: Family Medicine

## 2021-02-06 DIAGNOSIS — G43719 Chronic migraine without aura, intractable, without status migrainosus: Secondary | ICD-10-CM

## 2021-02-06 NOTE — Progress Notes (Signed)
Chief Complaint:   OBESITY Kara King is here to discuss her progress with her obesity treatment plan along with follow-up of her obesity related diagnoses.   Today's visit was #: 9 Starting weight: 240 lbs Starting date: 08/08/2020 Today's weight: 208 lbs Today's date: 01/29/2021 Weight change since last visit: 1 lb Total lbs lost to date: 31 lbs Body mass index is 34.61 kg/m.  Total weight loss percentage to date: -13.33%  Interim History:  Kara King is still struggling with staying motivated on plan.  Plan:  Focus on mindfulness and being intentional with self care.  Current Meal Plan: the Category 2 Plan +100 calories for 0% of the time.  Current Exercise Plan: Boot camp for 45 minutes 3-4 times per week. Current Anti-Obesity Medications: metformin 500 mg daily. Side effects: None.  Assessment/Plan:   Medications Discontinued During This Encounter  Medication Reason   Vitamin D, Ergocalciferol, (DRISDOL) 1.25 MG (50000 UNIT) CAPS capsule Reorder   metFORMIN (GLUCOPHAGE) 500 MG tablet Reorder   Meds ordered this encounter  Medications   metFORMIN (GLUCOPHAGE) 500 MG tablet    Sig: 1 po qd with lunch    Dispense:  30 tablet    Refill:  0    Ov for RF   Vitamin D, Ergocalciferol, (DRISDOL) 1.25 MG (50000 UNIT) CAPS capsule    Sig: Take 1 capsule (50,000 Units total) by mouth every 7 (seven) days.    Dispense:  4 capsule    Refill:  0    Ov for rf    1. Vitamin D deficiency At goal.  She is taking vitamin D 50,000 IU weekly.  Plan: Continue to take prescription Vitamin D '@50'$ ,000 IU every week as prescribed.  Follow-up for routine testing of Vitamin D, at least 2-3 times per year to avoid over-replacement.  Lab Results  Component Value Date   VD25OH 55.6 12/18/2020   VD25OH 21.9 (L) 08/08/2020   - Refill Vitamin D, Ergocalciferol, (DRISDOL) 1.25 MG (50000 UNIT) CAPS capsule; Take 1 capsule (50,000 Units total) by mouth every 7 (seven) days.  Dispense: 4  capsule; Refill: 0  2. Depression, unspecified depression type- emotional eating Not at goal. Medication: None.  She sees Kara King for counseling.  No need for medication, but feels counseling would be great for her specific to her emotional eating.  Plan:  Patient was referred to Dr. Mallie Mussel, our Bariatric Psychologist, for evaluation due to her elevated PHQ-9 score and significant struggles with emotional eating.  Handouts on emotional eating given to patient.  Long discussion had with patient.  3. At risk for impaired metabolic function Due to Kara King's current state of health and medical condition(s), she is at a significantly higher risk for impaired metabolic function.   At least 15 minutes was spent on counseling Kara King about these concerns today.  This places the patient at a much greater risk to subsequently develop cardio-pulmonary conditions that can negatively affect the patient's quality of life.  I stressed the importance of reversing these risks factors.  The initial goal is to lose at least 5-10% of starting weight to help reduce risk factors.  Counseling:  Intensive lifestyle modifications discussed with Kara King as the most appropriate first line treatment.  she will continue to work on diet, exercise, and weight loss efforts.  We will continue to reassess these conditions on a fairly regular basis in an attempt to decrease the patient's overall morbidity and mortality.  4. Obesity, current BMI 36.1  - Refill metFORMIN (  GLUCOPHAGE) 500 MG tablet; 1 po qd with lunch  Dispense: 30 tablet; Refill: 0  Course: Kara King is currently in the action stage of change. As such, her goal is to continue with weight loss efforts.   Nutrition goals: She has agreed to the Category 2 Plan +100 calories.   Exercise goals: For substantial health benefits, adults should do at least 150 minutes (2 hours and 30 minutes) a week of moderate-intensity, or 75 minutes (1 hour and 15 minutes) a week  of vigorous-intensity aerobic physical activity, or an equivalent combination of moderate- and vigorous-intensity aerobic activity. Aerobic activity should be performed in episodes of at least 10 minutes, and preferably, it should be spread throughout the week.  Behavioral modification strategies: emotional eating strategies, avoiding temptations, and planning for success.  Kara King has agreed to follow-up with our clinic in 3 weeks. She was informed of the importance of frequent follow-up visits to maximize her success with intensive lifestyle modifications for her multiple health conditions.   Objective:   Blood pressure 119/74, pulse (!) 45, temperature 98.2 F (36.8 C), height '5\' 5"'$  (1.651 m), weight 208 lb (94.3 kg), last menstrual period 09/30/2016, SpO2 98 %. Body mass index is 34.61 kg/m.  General: Cooperative, alert, well developed, in no acute distress. HEENT: Conjunctivae and lids unremarkable. Cardiovascular: Regular rhythm.  Lungs: Normal work of breathing. Neurologic: No focal deficits.   Lab Results  Component Value Date   CREATININE 0.75 12/18/2020   BUN 16 12/18/2020   NA 146 (H) 12/18/2020   K 4.4 12/18/2020   CL 110 (H) 12/18/2020   CO2 22 12/18/2020   Lab Results  Component Value Date   ALT 18 12/18/2020   AST 23 12/18/2020   ALKPHOS 81 12/18/2020   BILITOT 0.4 12/18/2020   Lab Results  Component Value Date   HGBA1C 5.6 08/08/2020   Lab Results  Component Value Date   INSULIN 4.5 08/08/2020   Lab Results  Component Value Date   TSH 1.450 08/08/2020   Lab Results  Component Value Date   CHOL 213 (H) 12/18/2020   HDL 65 12/18/2020   LDLCALC 136 (H) 12/18/2020   TRIG 69 12/18/2020   CHOLHDL 3.3 12/18/2020   Lab Results  Component Value Date   VD25OH 55.6 12/18/2020   VD25OH 21.9 (L) 08/08/2020   Lab Results  Component Value Date   WBC 4.9 08/08/2020   HGB 14.0 08/08/2020   HCT 43.8 08/08/2020   MCV 91 08/08/2020   PLT 268 08/08/2020    Attestation Statements:   Reviewed by clinician on day of visit: allergies, medications, problem list, medical history, surgical history, family history, social history, and previous encounter notes.  I, Water quality scientist, CMA, am acting as Location manager for [employer], DO.  I have reviewed the above documentation for accuracy and completeness, and I agree with the above. Marjory Sneddon, D.O.  The [place] was signed into law in 2016 which includes the topic of electronic health records.  This provides immediate access to information in MyChart.  This includes consultation notes, operative notes, office notes, lab results and pathology reports.  If you have any questions about what you read please let us know at your next visit so we can discuss your concerns and take corrective action if need be.  We are right here with you.

## 2021-02-11 NOTE — Progress Notes (Signed)
Office: [phone]  /  Fax: [phone]    Date: February 25, 2021   Appointment Start Time: 10:04am Duration: 30 minutes Provider: Glennie Isle, Psy.D. Type of Session: Individual Therapy  Location of Patient: Parked in car at work Location of Provider: Provider's home (private office) Type of Contact: Telepsychological Visit via MyChart Video Visit  Session Content:This provider called Kara King at 10:03am as she did not present for today's appointment. She indicated she was in the process of trying to join. As such, today's appointment was initiated 4 minutes late. Kara King is a 55 y.o. female presenting for a follow-up appointment to address the previously established treatment goal of increasing coping skills. Today's appointment was a telepsychological visit due to COVID-19. Kara King provided verbal consent for today's telepsychological appointment and she is aware she is responsible for securing confidentiality on her end of the session. Prior to proceeding with today's appointment, Kara King's physical location at the time of this appointment was obtained as well a phone number she could be reached at in the event of technical difficulties. Kara King and this provider participated in today's telepsychological service. Of note, today's appointment was switched to a regular telephone call at 10:18am with Kara King's verbal consent due to technical issues.   This provider conducted a brief check-in. Kara King shared about recent events, including helping her daughter move. She indicated she focused on making better choices while she was away. Kara King shared about her experience reflecting on emotional and physical hunger patterns. She stated she often looks for a snack after she comes home from work, noting she usually craves sweets. This was further explored and it was reflected she is likely going to long between lunch and dinner without eating. She was receptive to planning a snack  with protein between those two meals. Psychoeducation regarding triggers for emotional eating was provided. Kara King was provided a handout, and encouraged to utilize the handout between now and the next appointment to increase awareness of triggers and frequency. Kara King agreed. This provider also discussed behavioral strategies for specific triggers, such as placing the utensil down when conversing to avoid mindless eating. Kara King provided verbal consent during today's appointment for this provider to send a handout about triggers via e-mail. Additionally, psychoeducation provided regarding all or nothing thinking. Overall, Kara King was receptive to today's appointment as evidenced by openness to sharing, responsiveness to feedback, and willingness to explore triggers for emotional eating.  Mental Status Examination:  Appearance: well groomed and appropriate hygiene  Behavior: appropriate to circumstances Mood: euthymic Affect: mood congruent Speech: normal in rate, volume, and tone Eye Contact: appropriate Psychomotor Activity: unable to assess Gait: unable to assess Thought Process: linear, logical, and goal directed  Thought Content/Perception: no hallucinations, delusions, bizarre thinking or behavior reported or observed and no evidence or endorsement of suicidal and homicidal ideation, plan, and intent Orientation: time, person, place, and purpose of appointment Memory/Concentration: memory, attention, language, and fund of knowledge intact  Insight/Judgment: fair  Interventions:  Conducted a brief chart review Provided empathic reflections and validation Reviewed content from the previous session Processed thoughts and feelings Psychoeducation provided regarding triggers for emotional eating Employed supportive psychotherapy interventions to facilitate reduced distress, and to improve coping skills with identified stressors Psychoeducation provided regarding all or nothing  thinking  DSM-5 Diagnosis(es): F50.89 Other Specified Feeding or Eating Disorder, Emotional Eating Behaviors  Treatment Goal & Progress: During the initial appointment with this provider, the following treatment goal was established: increase coping skills. Kara King has demonstrated some progress in her goal  as evidenced by increased awareness of hunger patterns.   Plan: The next appointment will be scheduled in three weeks, which will be via MyChart Video Visit. The next session will focus on working towards the established treatment goal.

## 2021-02-19 ENCOUNTER — Encounter (INDEPENDENT_AMBULATORY_CARE_PROVIDER_SITE_OTHER): Payer: Self-pay | Admitting: Family Medicine

## 2021-02-19 ENCOUNTER — Other Ambulatory Visit: Payer: Self-pay

## 2021-02-19 ENCOUNTER — Ambulatory Visit (INDEPENDENT_AMBULATORY_CARE_PROVIDER_SITE_OTHER): Payer: 59 | Admitting: Family Medicine

## 2021-02-19 VITALS — BP 108/71 | HR 46 | Temp 98.2°F | Ht 65.0 in | Wt 206.0 lb

## 2021-02-19 DIAGNOSIS — F39 Unspecified mood [affective] disorder: Secondary | ICD-10-CM

## 2021-02-19 DIAGNOSIS — Z9189 Other specified personal risk factors, not elsewhere classified: Secondary | ICD-10-CM

## 2021-02-19 DIAGNOSIS — E559 Vitamin D deficiency, unspecified: Secondary | ICD-10-CM

## 2021-02-19 DIAGNOSIS — Z6839 Body mass index (BMI) 39.0-39.9, adult: Secondary | ICD-10-CM

## 2021-02-19 MED ORDER — VITAMIN D (ERGOCALCIFEROL) 1.25 MG (50000 UNIT) PO CAPS: 50000.0000 [IU] | ORAL_CAPSULE | ORAL | 0 refills | Status: DC

## 2021-02-19 MED ORDER — PHENTERMINE HCL 37.5 MG PO TABS: ORAL_TABLET | ORAL | 0 refills | Status: DC

## 2021-02-19 NOTE — Progress Notes (Signed)
Chief Complaint:   OBESITY Kara King is here to discuss her progress with her obesity treatment plan along with follow-up of her obesity related diagnoses. Kara King is on the Category 2 Plan + 100 calories and states she is following her eating plan approximately 98% of the time. Kara King states she is doing female in action bootcamp for 45 minutes 3 times per week.  Today's visit was #: 10 Starting weight: 240 lbs Starting date: 08/08/2020 Today's weight: 206 lbs Today's date: 02/19/2021 Total lbs lost to date: 34 Total lbs lost since last in-office visit: 2  Interim History: Kara King was away for a week and she ate every meal, and she is happy she lost. She did portion control and made smarter choices.  Subjective:   1. Vitamin D deficiency Kara King is currently taking prescription vitamin D 50,000 IU each week. She denies nausea, vomiting or muscle weakness.  2. 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 She went to see Dr. Mallie Mussel once, and she has a follow up.  3. At risk for side effect of medication Kara King is at risk for drug side effects due to new medications.  Assessment/Plan:  No orders of the defined types were placed in this encounter.   Medications Discontinued During This Encounter  Medication Reason   gabapentin (NEURONTIN) 100 MG capsule Error   Vitamin D, Ergocalciferol, (DRISDOL) 1.25 MG (50000 UNIT) CAPS capsule Reorder     Meds ordered this encounter  Medications   DISCONTD: Vitamin D, Ergocalciferol, (DRISDOL) 1.25 MG (50000 UNIT) CAPS capsule    Sig: Take 1 capsule (50,000 Units total) by mouth every 7 (seven) days.    Dispense:  4 capsule    Refill:  0    Ov for rf   DISCONTD: phentermine (ADIPEX-P) 37.5 MG tablet    Sig: 1/2 tab po q am    Dispense:  15 tablet    Refill:  0    Needs OV for RF     1. Vitamin D  deficiency Low Vitamin D level contributes to fatigue and are associated with obesity, breast, and colon cancer. We will refill prescription Vitamin D for 1 month. Kara King will follow-up for routine testing of Vitamin D, at least 2-3 times per year to avoid over-replacement.  - Vitamin D, Ergocalciferol, (DRISDOL) 1.25 MG (50000 UNIT) CAPS capsule; Take 1 capsule (50,000 Units total) by mouth every 7 (seven) days.  Dispense: 4 capsule; Refill: 0  2. 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 Orders and follow up as documented in patient record.   3. At risk for side effect of medication Due to Kara King's current conditions and medications, she is at a higher risk for drug side effect.  At least 9 minutes was spent on counseling her about these concerns today.  We discussed the benefits and potential risks of these medications, and all of patient's concerns were addressed and questions were answered.  she will call us, or their PCP or other specialists who treat their conditions with medications, with any questions or concerns that may develop.    4. Obesity, current BMI 36.1 Kara King is currently in the action stage of change. As such, her goal is to continue with weight loss efforts. She has agreed to the Category 2 Plan + 100 calories.  Kara King is to breakup lunch into 2 smaller meals.  We discussed various medication options to help Kara King with her weight loss efforts, and metformin does not help and she can not take Wellbutrin. Risk and benefits of phentermine was discussed with the patient today. A trial of phentermine 1/2 tablet q AM #15 was given.  - phentermine (ADIPEX-P) 37.5 MG tablet; 1/2 tab po q am  Dispense: 15 tablet; Refill: 0  Exercise goals: As is.  Behavioral modification strategies: increasing lean protein intake, decreasing simple  carbohydrates, better snacking choices, and emotional eating strategies.  Kara King has agreed to follow-up with our clinic in 2 weeks. She was informed of the importance of frequent follow-up visits to maximize her success with intensive lifestyle modifications for her multiple health conditions.   Objective:   Blood pressure 108/71, pulse (!) 46, temperature 98.2 F (36.8 C), height '5\' 5"'$  (1.651 m), weight 206 lb (93.4 kg), last menstrual period 09/30/2016, SpO2 97 %. Body mass index is 34.28 kg/m.  General: Cooperative, alert, well developed, in no acute distress. HEENT: Conjunctivae and lids unremarkable. Cardiovascular: Regular rhythm.  Lungs: Normal work of breathing. Neurologic: No focal deficits.   Lab Results  Component Value Date   CREATININE 0.75 12/18/2020   BUN 16 12/18/2020   NA 146 (H) 12/18/2020   K 4.4 12/18/2020   CL 110 (H) 12/18/2020   CO2 22 12/18/2020   Lab Results  Component Value Date   ALT 18 12/18/2020   AST 23 12/18/2020   ALKPHOS 81 12/18/2020   BILITOT 0.4 12/18/2020   Lab Results  Component Value Date   HGBA1C 5.6 08/08/2020   Lab Results  Component Value Date   INSULIN 4.5 08/08/2020   Lab Results  Component Value Date   TSH 1.450 08/08/2020   Lab Results  Component Value Date   CHOL 213 (H) 12/18/2020   HDL 65 12/18/2020   LDLCALC 136 (H) 12/18/2020   TRIG 69 12/18/2020   CHOLHDL 3.3 12/18/2020   Lab Results  Component Value Date   VD25OH 55.6 12/18/2020   VD25OH 21.9 (L) 08/08/2020   Lab Results  Component Value Date   WBC 4.9 08/08/2020   HGB 14.0 08/08/2020   HCT 43.8 08/08/2020   MCV 91 08/08/2020   PLT 268 08/08/2020   No results found for: IRON, TIBC, FERRITIN  Attestation Statements:   Reviewed by clinician on day of visit: allergies, medications, problem list, medical history, surgical history, family history, social history, and previous encounter notes.   Wilhemena Durie, am acting as transcriptionist  for [employer], DO.  I have reviewed the above documentation for accuracy and completeness, and I agree with the above. Marjory Sneddon, D.O.  The [place] was signed into law in 2016 which includes the topic of electronic health records.  This provides immediate access to information in MyChart.  This includes consultation notes, operative notes, office notes, lab results and pathology reports.  If you have any questions about what you read please let us know at your next visit so we can discuss your concerns and take corrective action if need be.  We are right here with you.

## 2021-02-25 ENCOUNTER — Telehealth (INDEPENDENT_AMBULATORY_CARE_PROVIDER_SITE_OTHER): Payer: 59 | Admitting: Psychology

## 2021-02-25 DIAGNOSIS — F5089 Other specified eating disorder: Secondary | ICD-10-CM | POA: Diagnosis not present

## 2021-03-04 ENCOUNTER — Encounter (INDEPENDENT_AMBULATORY_CARE_PROVIDER_SITE_OTHER): Payer: Self-pay | Admitting: Family Medicine

## 2021-03-04 ENCOUNTER — Other Ambulatory Visit: Payer: Self-pay

## 2021-03-04 ENCOUNTER — Ambulatory Visit (INDEPENDENT_AMBULATORY_CARE_PROVIDER_SITE_OTHER): Payer: 59 | Admitting: Family Medicine

## 2021-03-04 VITALS — BP 116/71 | HR 48 | Temp 97.8°F | Ht 65.0 in | Wt 205.0 lb

## 2021-03-04 DIAGNOSIS — Z6839 Body mass index (BMI) 39.0-39.9, adult: Secondary | ICD-10-CM

## 2021-03-04 DIAGNOSIS — Z9189 Other specified personal risk factors, not elsewhere classified: Secondary | ICD-10-CM

## 2021-03-04 DIAGNOSIS — E559 Vitamin D deficiency, unspecified: Secondary | ICD-10-CM | POA: Diagnosis not present

## 2021-03-04 MED ORDER — PHENTERMINE HCL 37.5 MG PO TABS: ORAL_TABLET | ORAL | 0 refills | Status: DC

## 2021-03-04 MED ORDER — VITAMIN D (ERGOCALCIFEROL) 1.25 MG (50000 UNIT) PO CAPS: 50000.0000 [IU] | ORAL_CAPSULE | ORAL | 0 refills | Status: DC

## 2021-03-04 NOTE — Progress Notes (Unsigned)
  Office: [phone]  /  Fax: [phone]    Date: March 18, 2021   Appointment Start Time: *** Duration: *** minutes Provider: Glennie Isle, Psy.D. Type of Session: Individual Therapy  Location of Patient: {gbptloc:23249} Location of Provider: Provider's Home (private office) Type of Contact: Telepsychological Visit via {MyChart Video Visit  Session Content: Kara King is a 55 y.o. female presenting for a follow-up appointment to address the previously established treatment goal of increasing coping skills. Today's appointment was a telepsychological visit due to COVID-19. Kara King provided verbal consent for today's telepsychological appointment and she is aware she is responsible for securing confidentiality on her end of the session. Prior to proceeding with today's appointment, Kara King's physical location at the time of this appointment was obtained as well a phone number she could be reached at in the event of technical difficulties. Kara King and this provider participated in today's telepsychological service.   This provider conducted a brief check-in. *** Kara King was receptive to today's appointment as evidenced by openness to sharing, responsiveness to feedback, and {gbreceptiveness:23401}.  Mental Status Examination:  Appearance: {Appearance:22431} Behavior: {Behavior:22445} Mood: {gbmood:21757} Affect: {Affect:22436} Speech: {Speech:22432} Eye Contact: {Eye Contact:22433} Psychomotor Activity: {Motor Activity:22434} Gait: {gbgait:23404} Thought Process: {thought process:22448}  Thought Content/Perception: {disturbances:22451} Orientation: {Orientation:22437} Memory/Concentration: {gbcognition:22449} Insight/Judgment: {Insight:22446}  Interventions:  {Interventions for Progress Notes:23405}  DSM-5 Diagnosis(es): F50.89 Other Specified Feeding or Eating Disorder, Emotional Eating Behaviors  Treatment Goal & Progress: During the initial appointment with this  provider, the following treatment goal was established: increase coping skills. Kara King has demonstrated progress in her goal as evidenced by {gbtxprogress:22839}. Kara King also {gbtxprogress2:22951}.  Plan: The next appointment will be scheduled in {gbweeks:21758}, which will be {gbtxmodality:23402}. The next session will focus on {Plan for Next Appointment:23400}.

## 2021-03-05 NOTE — Progress Notes (Signed)
Chief Complaint:   OBESITY Kara King is here to discuss her progress with her obesity treatment plan along with follow-up of her obesity related diagnoses. Kara King is on the Category 2 Plan + 100 calories and states she is following her eating plan approximately 98% of the time. Kara King states she is doing boot camp for 45 minutes 3-4 times per week.  Today's visit was #: 11 Starting weight: 240 lbs Starting date: 08/08/2020 Today's weight: 205 lbs Today's date: 03/04/2021 Total lbs lost to date: 35 Total lbs lost since last in-office visit: 1  Interim History: Kara King started phentermine at her last office visit, and she is tolerating it well now but she has been short-tempered and now leveled off. She would like to continue phentermine. She is sleeping well and she denies headaches or heart racing, and she is able to eat her food on the plan. She feels she needs to stop eating out.  Subjective:   1. Vitamin D deficiency Kara King is currently taking prescription vitamin D 50,000 IU each week. She denies nausea, vomiting or muscle weakness.  2. At risk for side effect of medication Kara King is at risk for drug side effects.  Assessment/Plan:  No orders of the defined types were placed in this encounter.   Medications Discontinued During This Encounter  Medication Reason   metFORMIN (GLUCOPHAGE) 500 MG tablet    phentermine (ADIPEX-P) 37.5 MG tablet Reorder   Vitamin D, Ergocalciferol, (DRISDOL) 1.25 MG (50000 UNIT) CAPS capsule Reorder     Meds ordered this encounter  Medications   phentermine (ADIPEX-P) 37.5 MG tablet    Sig: 1/2 tab po q am    Dispense:  15 tablet    Refill:  0    Needs OV for RF; fill on Sept 6th or after   Vitamin D, Ergocalciferol, (DRISDOL) 1.25 MG (50000 UNIT) CAPS capsule    Sig: Take 1 capsule (50,000 Units total) by mouth every 7 (seven) days.    Dispense:  4 capsule    Refill:  0    Ov for rf     1. Vitamin D deficiency Low  Vitamin D level contributes to fatigue and are associated with obesity, breast, and colon cancer. We will refill prescription Vitamin D for 1 month. Kara King will follow-up for routine testing of Vitamin D, at least 2-3 times per year to avoid over-replacement.  - Vitamin D, Ergocalciferol, (DRISDOL) 1.25 MG (50000 UNIT) CAPS capsule; Take 1 capsule (50,000 Units total) by mouth every 7 (seven) days.  Dispense: 4 capsule; Refill: 0  2. At risk for side effect of medication Due to Kara King's current conditions and medications, she is at a higher risk for drug side effect.  At least 9 minutes was spent on counseling her about these concerns today.  We discussed the benefits and potential risks of these medications, and all of patient's concerns were addressed and questions were answered.  she will call us, or their PCP or other specialists who treat their conditions with medications, with any questions or concerns that may develop.    3. Obesity, current BMI 34.2 Kara King is currently in the action stage of change. As such, her goal is to continue with weight loss efforts. She has agreed to change to keeping a food journal and adhering to recommended goals of 1000-1100 calories and 85+ grams of protein daily.   We discussed various medication options to help Kara King with her weight loss efforts and we both agreed to continue phentermine  at the same dose, and we will refill for 1 month.  - phentermine (ADIPEX-P) 37.5 MG tablet; 1/2 tab po q am  Dispense: 15 tablet; Refill: 0  Exercise goals: As is.  Behavioral modification strategies: decreasing eating out and no skipping meals.  Kara King has agreed to follow-up with our clinic in 3 weeks. She was informed of the importance of frequent follow-up visits to maximize her success with intensive lifestyle modifications for her multiple health conditions.   Objective:   Blood pressure 116/71, pulse (!) 48, temperature 97.8 F (36.6 C), height 5'  5" (1.651 m), weight 205 lb (93 kg), last menstrual period 09/30/2016, SpO2 98 %. Body mass index is 34.11 kg/m.  General: Cooperative, alert, well developed, in no acute distress. HEENT: Conjunctivae and lids unremarkable. Cardiovascular: Regular rhythm.  Lungs: Normal work of breathing. Neurologic: No focal deficits.   Lab Results  Component Value Date   CREATININE 0.75 12/18/2020   BUN 16 12/18/2020   NA 146 (H) 12/18/2020   K 4.4 12/18/2020   CL 110 (H) 12/18/2020   CO2 22 12/18/2020   Lab Results  Component Value Date   ALT 18 12/18/2020   AST 23 12/18/2020   ALKPHOS 81 12/18/2020   BILITOT 0.4 12/18/2020   Lab Results  Component Value Date   HGBA1C 5.6 08/08/2020   Lab Results  Component Value Date   INSULIN 4.5 08/08/2020   Lab Results  Component Value Date   TSH 1.450 08/08/2020   Lab Results  Component Value Date   CHOL 213 (H) 12/18/2020   HDL 65 12/18/2020   LDLCALC 136 (H) 12/18/2020   TRIG 69 12/18/2020   CHOLHDL 3.3 12/18/2020   Lab Results  Component Value Date   VD25OH 55.6 12/18/2020   VD25OH 21.9 (L) 08/08/2020   Lab Results  Component Value Date   WBC 4.9 08/08/2020   HGB 14.0 08/08/2020   HCT 43.8 08/08/2020   MCV 91 08/08/2020   PLT 268 08/08/2020   No results found for: IRON, TIBC, FERRITIN  Attestation Statements:   Reviewed by clinician on day of visit: allergies, medications, problem list, medical history, surgical history, family history, social history, and previous encounter notes.   Wilhemena Durie, am acting as transcriptionist for [employer], DO.  I have reviewed the above documentation for accuracy and completeness, and I agree with the above. Marjory Sneddon, D.O.  The [place] was signed into law in 2016 which includes the topic of electronic health records.  This provides immediate access to information in MyChart.  This includes consultation notes, operative notes, office notes, lab  results and pathology reports.  If you have any questions about what you read please let us know at your next visit so we can discuss your concerns and take corrective action if need be.  We are right here with you.

## 2021-03-18 ENCOUNTER — Telehealth (INDEPENDENT_AMBULATORY_CARE_PROVIDER_SITE_OTHER): Payer: 59 | Admitting: Psychology

## 2021-03-27 ENCOUNTER — Ambulatory Visit (INDEPENDENT_AMBULATORY_CARE_PROVIDER_SITE_OTHER): Payer: 59 | Admitting: Family Medicine

## 2021-03-27 ENCOUNTER — Encounter (INDEPENDENT_AMBULATORY_CARE_PROVIDER_SITE_OTHER): Payer: Self-pay | Admitting: Family Medicine

## 2021-03-27 ENCOUNTER — Other Ambulatory Visit: Payer: Self-pay

## 2021-03-27 VITALS — BP 128/66 | HR 55 | Temp 97.4°F | Ht 65.0 in | Wt 200.0 lb

## 2021-03-27 DIAGNOSIS — Z6839 Body mass index (BMI) 39.0-39.9, adult: Secondary | ICD-10-CM

## 2021-03-27 DIAGNOSIS — E559 Vitamin D deficiency, unspecified: Secondary | ICD-10-CM

## 2021-03-27 DIAGNOSIS — Z9189 Other specified personal risk factors, not elsewhere classified: Secondary | ICD-10-CM | POA: Diagnosis not present

## 2021-03-27 DIAGNOSIS — R632 Polyphagia: Secondary | ICD-10-CM | POA: Diagnosis not present

## 2021-03-27 DIAGNOSIS — K5900 Constipation, unspecified: Secondary | ICD-10-CM | POA: Diagnosis not present

## 2021-03-27 MED ORDER — VITAMIN D (ERGOCALCIFEROL) 1.25 MG (50000 UNIT) PO CAPS: 50000.0000 [IU] | ORAL_CAPSULE | ORAL | 0 refills | Status: DC

## 2021-03-27 MED ORDER — POLYETHYLENE GLYCOL 3350 17 GM/SCOOP PO POWD
17.0000 g | Freq: Two times a day (BID) | ORAL | 1 refills | Status: DC | PRN
Start: 2021-03-27 — End: 2021-05-08

## 2021-03-31 NOTE — Progress Notes (Signed)
Chief Complaint:   OBESITY Kara King is here to discuss her progress with her obesity treatment plan along with follow-up of her obesity related diagnoses. Kara King is on keeping a food journal and adhering to recommended goals of 1000-1100 calories and 85+ grams of protein daily and states she is following her eating plan approximately 100% of the time. Kara King states she is doing boot camp for 45 minutes 3-4 times per week.  Today's visit was #: 12 Starting weight: 240 lbs Starting date: 08/08/2020 Today's weight: 200 lbs Today's date: 03/27/2021 Total lbs lost to date: 40 Total lbs lost since last in-office visit: 5  Interim History: Kara King is eating all of her food on the plan 100% of the time. She is hitting her goals, and she denies hunger and her cravings are way down now with the phentermine. She loved the journaling and it helped her identify areas to improve upon. She is happy with her 5 lb weight loss today.  Subjective:   1. Polyphagia Kara King notes phentermine is working well. She feels it helps with her appetite and cravings. She notes some headache with use, but she takes a drug holiday and it resolves.  2. Constipation, unspecified constipation type Kara King notes constipation but she is not taking anything for it. She notes being constipated after 1 week or so.  3. Vitamin D deficiency Kara King is currently taking prescription vitamin D 50,000 IU each week. She denies nausea, vomiting or muscle weakness.  4. At risk for side effect of medication Kara King is at risk for drug side effects with the phentermine.  Assessment/Plan:  No orders of the defined types were placed in this encounter.   Medications Discontinued During This Encounter  Medication Reason   Vitamin D, Ergocalciferol, (DRISDOL) 1.25 MG (50000 UNIT) CAPS capsule Reorder     Meds ordered this encounter  Medications   Vitamin D, Ergocalciferol, (DRISDOL) 1.25 MG (50000 UNIT) CAPS  capsule    Sig: Take 1 capsule (50,000 Units total) by mouth every 7 (seven) days.    Dispense:  4 capsule    Refill:  0    Ov for rf   polyethylene glycol powder (GLYCOLAX/MIRALAX) 17 GM/SCOOP powder    Sig: Take 17 g by mouth 2 (two) times daily as needed.    Dispense:  3350 g    Refill:  1     1. Polyphagia Intensive lifestyle modifications are the first line treatment for this issue. We discussed several lifestyle modifications today. Kara King wishes to continue with 1/2 table of phentermine. She will continue to work on diet, exercise and weight loss efforts. Orders and follow up as documented in patient record.  Counseling Polyphagia is excessive hunger. Causes can include: low blood sugars, hypERthyroidism, PMS, lack of sleep, stress, insulin resistance, diabetes, certain medications, and diets that are deficient in protein and fiber.   2. Constipation, unspecified constipation type Kara King agreed to start miralax BID with no refills until her stool is regular then change to q daily for 2-3 days, then decrease to as needed. She is to increase her water intake and track her amount. She was informed that a decrease in bowel movement frequency is normal while losing weight, but stools should not be hard or painful. Orders and follow up as documented in patient record.   Counseling Getting to Good Bowel Health: Your goal is to have one soft bowel movement each day. Drink at least 8 glasses of water each day. Eat plenty of fiber (  goal is over 25 grams each day). It is best to get most of your fiber from dietary sources which includes leafy green vegetables, fresh fruit, and whole grains. You may need to add fiber with the help of OTC fiber supplements. These include Metamucil, Citrucel, and Flaxseed. If you are still having trouble, try adding Miralax or Magnesium Citrate. If all of these changes do not work, Cabin crew.  - polyethylene glycol powder (GLYCOLAX/MIRALAX) 17  GM/SCOOP powder; Take 17 g by mouth 2 (two) times daily as needed.  Dispense: 3350 g; Refill: 1  3. Vitamin D deficiency Low Vitamin D level contributes to fatigue and are associated with obesity, breast, and colon cancer. We will refill prescription Vitamin D for 1 month. Kara King will follow-up for routine testing of Vitamin D, at least 2-3 times per year to avoid over-replacement.  - Vitamin D, Ergocalciferol, (DRISDOL) 1.25 MG (50000 UNIT) CAPS capsule; Take 1 capsule (50,000 Units total) by mouth every 7 (seven) days.  Dispense: 4 capsule; Refill: 0  4. At risk for side effect of medication Kara King was given approximately 9 minutes of drug side effect counseling today.  We discussed side effect possibility and risk versus benefits. Kara King agreed to the medication and will contact this office if these side effects are intolerable.  Repetitive spaced learning was employed today to elicit superior memory formation and behavioral change.  5. Obesity, current BMI 33.3 Kara King is currently in the action stage of change. As such, her goal is to continue with weight loss efforts. She has agreed to keeping a food journal and adhering to recommended goals of 1000-1100 calories and 85+ grams of protein daily.   Exercise goals: As is.  Behavioral modification strategies: increasing lean protein intake, keeping healthy foods in the home, and keeping a strict food journal.  Kara King has agreed to follow-up with our clinic in 2 to 3 weeks. She was informed of the importance of frequent follow-up visits to maximize her success with intensive lifestyle modifications for her multiple health conditions.   Objective:   Blood pressure 128/66, pulse (!) 55, temperature (!) 97.4 F (36.3 C), height '5\' 5"'$  (1.651 m), weight 200 lb (90.7 kg), last menstrual period 09/30/2016, SpO2 99 %. Body mass index is 33.28 kg/m.  General: Cooperative, alert, well developed, in no acute distress. HEENT:  Conjunctivae and lids unremarkable. Cardiovascular: Regular rhythm.  Lungs: Normal work of breathing. Neurologic: No focal deficits.   Lab Results  Component Value Date   CREATININE 0.75 12/18/2020   BUN 16 12/18/2020   NA 146 (H) 12/18/2020   K 4.4 12/18/2020   CL 110 (H) 12/18/2020   CO2 22 12/18/2020   Lab Results  Component Value Date   ALT 18 12/18/2020   AST 23 12/18/2020   ALKPHOS 81 12/18/2020   BILITOT 0.4 12/18/2020   Lab Results  Component Value Date   HGBA1C 5.6 08/08/2020   Lab Results  Component Value Date   INSULIN 4.5 08/08/2020   Lab Results  Component Value Date   TSH 1.450 08/08/2020   Lab Results  Component Value Date   CHOL 213 (H) 12/18/2020   HDL 65 12/18/2020   LDLCALC 136 (H) 12/18/2020   TRIG 69 12/18/2020   CHOLHDL 3.3 12/18/2020   Lab Results  Component Value Date   VD25OH 55.6 12/18/2020   VD25OH 21.9 (L) 08/08/2020   Lab Results  Component Value Date   WBC 4.9 08/08/2020   HGB 14.0 08/08/2020   HCT  43.8 08/08/2020   MCV 91 08/08/2020   PLT 268 08/08/2020   No results found for: IRON, TIBC, FERRITIN  Attestation Statements:   Reviewed by clinician on day of visit: allergies, medications, problem list, medical history, surgical history, family history, social history, and previous encounter notes.   Wilhemena Durie, am acting as transcriptionist for [employer], DO.  I have reviewed the above documentation for accuracy and completeness, and I agree with the above. Marjory Sneddon, D.O.  The [place] was signed into law in 2016 which includes the topic of electronic health records.  This provides immediate access to information in MyChart.  This includes consultation notes, operative notes, office notes, lab results and pathology reports.  If you have any questions about what you read please let us know at your next visit so we can discuss your concerns and take corrective action if need be.  We are  right here with you.

## 2021-04-10 ENCOUNTER — Encounter (INDEPENDENT_AMBULATORY_CARE_PROVIDER_SITE_OTHER): Payer: Self-pay | Admitting: Family Medicine

## 2021-04-10 ENCOUNTER — Ambulatory Visit (INDEPENDENT_AMBULATORY_CARE_PROVIDER_SITE_OTHER): Payer: 59 | Admitting: Family Medicine

## 2021-04-10 ENCOUNTER — Other Ambulatory Visit: Payer: Self-pay

## 2021-04-10 VITALS — BP 114/72 | HR 46 | Temp 97.6°F | Ht 65.0 in | Wt 197.0 lb

## 2021-04-10 DIAGNOSIS — E559 Vitamin D deficiency, unspecified: Secondary | ICD-10-CM | POA: Diagnosis not present

## 2021-04-10 DIAGNOSIS — Z9189 Other specified personal risk factors, not elsewhere classified: Secondary | ICD-10-CM

## 2021-04-10 DIAGNOSIS — K5900 Constipation, unspecified: Secondary | ICD-10-CM

## 2021-04-10 DIAGNOSIS — Z6839 Body mass index (BMI) 39.0-39.9, adult: Secondary | ICD-10-CM

## 2021-04-10 DIAGNOSIS — R632 Polyphagia: Secondary | ICD-10-CM

## 2021-04-10 MED ORDER — PHENTERMINE HCL 37.5 MG PO TABS: ORAL_TABLET | ORAL | 0 refills | Status: DC

## 2021-04-10 MED ORDER — VITAMIN D (ERGOCALCIFEROL) 1.25 MG (50000 UNIT) PO CAPS: 50000.0000 [IU] | ORAL_CAPSULE | ORAL | 0 refills | Status: DC

## 2021-04-10 MED ORDER — MAGNESIUM SULFATE (LAXATIVE) POWD: 0 refills | Status: DC

## 2021-04-11 NOTE — Progress Notes (Signed)
Chief Complaint:   OBESITY Kara King is here to discuss her progress with her obesity treatment plan along with follow-up of her obesity related diagnoses. Kara King is on keeping a food journal and adhering to recommended goals of 1000-1100 calories and 85+ grams of protein daily and states she is following her eating plan approximately 98% of the time. Kara King states she is taking a workout class for 45 minutes 3 times per week.  Today's visit was #: 19 Starting weight: 240 lbs Starting date: 08/08/2020 Today's weight: 197 lbs Today's date: 04/10/2021 Total lbs lost to date: 43 Total lbs lost since last in-office visit: 3  Interim History: Kara King lost 17.92 % of total body weight thus far. Kara King is here for a follow up office visit.  We reviewed her meal plan and questions were answered.  Patient's food recall appears to be accurate and consistent with what is on plan when she is following it.   When eating on plan, her hunger and cravings are well controlled.    Subjective:   1. Polyphagia Kara King denies headaches any longer with phentermine. She notes good appetite control.  2. Vitamin D deficiency Kara King is currently taking prescription Vit D 50,000 IU weekly. She denies nausea, vomiting, or muscle weakness.  3. Constipation, unspecified constipation type Kara King used miralax BID for 10 days, and now she uses it once daily.  4. At risk for impaired metabolic function Kara King is at increased risk for impaired metabolic function due to current nutrition and muscle mass.  Assessment/Plan:  No orders of the defined types were placed in this encounter.   Medications Discontinued During This Encounter  Medication Reason   phentermine (ADIPEX-P) 37.5 MG tablet Reorder   Vitamin D, Ergocalciferol, (DRISDOL) 1.25 MG (50000 UNIT) CAPS capsule Reorder     Meds ordered this encounter  Medications   phentermine (ADIPEX-P) 37.5 MG tablet    Sig: 1/2 tab  po q am    Dispense:  15 tablet    Refill:  0    Needs OV for RF; fill on Sept 6th or after   Vitamin D, Ergocalciferol, (DRISDOL) 1.25 MG (50000 UNIT) CAPS capsule    Sig: Take 1 capsule (50,000 Units total) by mouth every 7 (seven) days.    Dispense:  4 capsule    Refill:  0    Ov for rf   Magnesium Sulfate, Laxative, POWD    Sig: Use CALM supp q hs prn.  ( 325 of Magnesium per serving )    Refill:  0     1. Polyphagia Intensive lifestyle modifications are the first line treatment for this issue. We discussed several lifestyle modifications today. We will refill phentermine for 1 month. Kara King will continue to work on diet, exercise and weight loss efforts. Orders and follow up as documented in patient record.  Counseling Polyphagia is excessive hunger. Causes can include: low blood sugars, hypERthyroidism, PMS, lack of sleep, stress, insulin resistance, diabetes, certain medications, and diets that are deficient in protein and fiber.   - phentermine (ADIPEX-P) 37.5 MG tablet; 1/2 tab po q am  Dispense: 15 tablet; Refill: 0  2. Vitamin D deficiency Low Vitamin D level contributes to fatigue and are associated with obesity, breast, and colon cancer. We will refill prescription Vitamin D 50,000 IU every week for 1 month. Kara King will follow-up for routine testing of Vitamin D, at least 2-3 times per year to avoid over-replacement.  - Vitamin D, Ergocalciferol, (DRISDOL) 1.25  MG (50000 UNIT) CAPS capsule; Take 1 capsule (50,000 Units total) by mouth every 7 (seven) days.  Dispense: 4 capsule; Refill: 0  3. Constipation, unspecified constipation type Kara King is to use CALM supplement q daily, and start magnesium 325-650 mg q daily. She is to increase her water intake and continue miralax as needed. She was informed that a decrease in bowel movement frequency is normal while losing weight, but stools should not be hard or painful. Orders and follow up as documented in patient record.    Counseling Getting to Good Bowel Health: Your goal is to have one soft bowel movement each day. Drink at least 8 glasses of water each day. Eat plenty of fiber (goal is over 25 grams each day). It is best to get most of your fiber from dietary sources which includes leafy green vegetables, fresh fruit, and whole grains. You may need to add fiber with the help of OTC fiber supplements. These include Metamucil, Citrucel, and Flaxseed. If you are still having trouble, try adding Miralax or Magnesium Citrate. If all of these changes do not work, Cabin crew.  - Magnesium Sulfate, Laxative, POWD; Use CALM supp q hs prn.  ( 325 of Magnesium per serving ); Refill: 0  4. At risk for impaired metabolic function Kara King was given approximately 9 minutes of impaired  metabolic function prevention counseling today. We discussed intensive lifestyle modifications today with an emphasis on specific nutrition and exercise instructions and strategies.   Repetitive spaced learning was employed today to elicit superior memory formation and behavioral change.  5. Obesity with current BMI of 32.9 Kara King is currently in the action stage of change. As such, her goal is to continue with weight loss efforts. She has agreed to keeping a food journal and adhering to recommended goals of 1000-1100 calories and 85+ grams of protein daily.   Exercise goals: As is.  Behavioral modification strategies: meal planning and cooking strategies.  Kara King has agreed to follow-up with our clinic in 4 weeks. She was informed of the importance of frequent follow-up visits to maximize her success with intensive lifestyle modifications for her multiple health conditions.   Objective:   Blood pressure 114/72, pulse (!) 46, temperature 97.6 F (36.4 C), height 5\' 5"  (1.651 m), weight 197 lb (89.4 kg), last menstrual period 09/30/2016, SpO2 98 %. Body mass index is 32.78 kg/m.  General: Cooperative, alert, well  developed, in no acute distress. HEENT: Conjunctivae and lids unremarkable. Cardiovascular: Regular rhythm.  Lungs: Normal work of breathing. Neurologic: No focal deficits.   Lab Results  Component Value Date   CREATININE 0.75 12/18/2020   BUN 16 12/18/2020   NA 146 (H) 12/18/2020   K 4.4 12/18/2020   CL 110 (H) 12/18/2020   CO2 22 12/18/2020   Lab Results  Component Value Date   ALT 18 12/18/2020   AST 23 12/18/2020   ALKPHOS 81 12/18/2020   BILITOT 0.4 12/18/2020   Lab Results  Component Value Date   HGBA1C 5.6 08/08/2020   Lab Results  Component Value Date   INSULIN 4.5 08/08/2020   Lab Results  Component Value Date   TSH 1.450 08/08/2020   Lab Results  Component Value Date   CHOL 213 (H) 12/18/2020   HDL 65 12/18/2020   LDLCALC 136 (H) 12/18/2020   TRIG 69 12/18/2020   CHOLHDL 3.3 12/18/2020   Lab Results  Component Value Date   VD25OH 55.6 12/18/2020   VD25OH 21.9 (L) 08/08/2020  Lab Results  Component Value Date   WBC 4.9 08/08/2020   HGB 14.0 08/08/2020   HCT 43.8 08/08/2020   MCV 91 08/08/2020   PLT 268 08/08/2020   No results found for: IRON, TIBC, FERRITIN  Attestation Statements:   Reviewed by clinician on day of visit: allergies, medications, problem list, medical history, surgical history, family history, social history, and previous encounter notes.   Wilhemena Durie, am acting as transcriptionist for [employer], DO.  I have reviewed the above documentation for accuracy and completeness, and I agree with the above. Marjory Sneddon, D.O.  The [place] was signed into law in 2016 which includes the topic of electronic health records.  This provides immediate access to information in MyChart.  This includes consultation notes, operative notes, office notes, lab results and pathology reports.  If you have any questions about what you read please let us know at your next visit so we can discuss your concerns and take  corrective action if need be.  We are right here with you.

## 2021-04-15 ENCOUNTER — Other Ambulatory Visit: Payer: Self-pay | Admitting: Family Medicine

## 2021-04-15 DIAGNOSIS — G43719 Chronic migraine without aura, intractable, without status migrainosus: Secondary | ICD-10-CM

## 2021-05-08 ENCOUNTER — Other Ambulatory Visit: Payer: Self-pay

## 2021-05-08 ENCOUNTER — Encounter (INDEPENDENT_AMBULATORY_CARE_PROVIDER_SITE_OTHER): Payer: Self-pay | Admitting: Family Medicine

## 2021-05-08 ENCOUNTER — Ambulatory Visit (INDEPENDENT_AMBULATORY_CARE_PROVIDER_SITE_OTHER): Payer: 59 | Admitting: Family Medicine

## 2021-05-08 VITALS — BP 107/71 | HR 5 | Temp 97.7°F | Ht 65.0 in | Wt 196.0 lb

## 2021-05-08 DIAGNOSIS — Z9189 Other specified personal risk factors, not elsewhere classified: Secondary | ICD-10-CM

## 2021-05-08 DIAGNOSIS — E559 Vitamin D deficiency, unspecified: Secondary | ICD-10-CM

## 2021-05-08 DIAGNOSIS — R632 Polyphagia: Secondary | ICD-10-CM

## 2021-05-08 DIAGNOSIS — Z6839 Body mass index (BMI) 39.0-39.9, adult: Secondary | ICD-10-CM

## 2021-05-12 NOTE — Progress Notes (Signed)
Chief Complaint:   OBESITY Kara King is here to discuss her progress with her obesity treatment plan along with follow-up of her obesity related diagnoses. Kara King is on keeping a food journal and adhering to recommended goals of 1000-1100 calories and 85+ grams of protein daily and states she is following her eating plan approximately 98% of the time. Kara King states she is doing boot camp for 45 minutes 4 times per week.  Today's visit was #: 14 Starting weight: 240 lbs Starting date: 08/08/2020 Today's weight: 196 lbs Today's date: 05/08/2021 Total lbs lost to date: 44 Total lbs lost since last in-office visit: 1  Interim History: Kara King is here for a follow up office visit.  We reviewed her meal plan and questions were answered.  Patient's food recall appears to be accurate and consistent with what is on plan when she is following it.   When eating on plan, her hunger and cravings are well controlled.    Subjective:   1. Polyphagia Kara King notes she is sleeping well and no anorexia. She is tolerating Adipex well.  2. Vitamin D deficiency Kara King is currently taking prescription vitamin D 50,000 IU each week. Her last Vit D level was at goal approximately 3 months ago. She denies nausea, vomiting or muscle weakness.  3. At risk for impaired metabolic function Kara King is at increased risk for impaired metabolic function due to obesity and hyperlipidemia.  Assessment/Plan:  No orders of the defined types were placed in this encounter.   Medications Discontinued During This Encounter  Medication Reason   polyethylene glycol powder (GLYCOLAX/MIRALAX) 17 GM/SCOOP powder Error     No orders of the defined types were placed in this encounter.    1. Polyphagia Intensive lifestyle modifications are the first line treatment for this issue. We discussed several lifestyle modifications today. Leland will continue Adipex, no refill needed. She will continue  her prudent nutritional plan and exercise. Orders and follow up as documented in patient record.  Counseling Polyphagia is excessive hunger. Causes can include: low blood sugars, hypERthyroidism, PMS, lack of sleep, stress, insulin resistance, diabetes, certain medications, and diets that are deficient in protein and fiber.   2. Vitamin D deficiency Low Vitamin D level contributes to fatigue and are associated with obesity, breast, and colon cancer. Kara King will continue prescription Vitamin D 50,000 IU every week and will follow-up for routine testing of Vitamin D, at least 2-3 times per year to avoid over-replacement.  3. At risk for impaired metabolic function Kara King was given approximately 15 minutes of impaired  metabolic function prevention counseling today. We discussed intensive lifestyle modifications today with an emphasis on specific nutrition and exercise instructions and strategies.   Repetitive spaced learning was employed today to elicit superior memory formation and behavioral change.  4. Obesityt with current BMI of 32.7 Kara King is currently in the action stage of change. As such, her goal is to continue with weight loss efforts. She has agreed to keeping a food journal and adhering to recommended goals of 1000-1100 calories and 85+ grams of protein daily.   Kara King is to increase her water intake by 1-2 bottles or more.  Exercise goals: As is.  Behavioral modification strategies: increasing lean protein intake, increasing high fiber foods, keeping healthy foods in the home, and planning for success.  Kara King has agreed to follow-up with our clinic in 1 to 2 weeks. She was informed of the importance of frequent follow-up visits to maximize her success with  intensive lifestyle modifications for her multiple health conditions.   Objective:   Blood pressure 107/71, pulse (!) 5, temperature 97.7 F (36.5 C), height 5\' 5"  (1.651 m), weight 196 lb (88.9 kg), last  menstrual period 09/30/2016, SpO2 100 %. Body mass index is 32.62 kg/m.  General: Cooperative, alert, well developed, in no acute distress. HEENT: Conjunctivae and lids unremarkable. Cardiovascular: Regular rhythm.  Lungs: Normal work of breathing. Neurologic: No focal deficits.   Lab Results  Component Value Date   CREATININE 0.75 12/18/2020   BUN 16 12/18/2020   NA 146 (H) 12/18/2020   K 4.4 12/18/2020   CL 110 (H) 12/18/2020   CO2 22 12/18/2020   Lab Results  Component Value Date   ALT 18 12/18/2020   AST 23 12/18/2020   ALKPHOS 81 12/18/2020   BILITOT 0.4 12/18/2020   Lab Results  Component Value Date   HGBA1C 5.6 08/08/2020   Lab Results  Component Value Date   INSULIN 4.5 08/08/2020   Lab Results  Component Value Date   TSH 1.450 08/08/2020   Lab Results  Component Value Date   CHOL 213 (H) 12/18/2020   HDL 65 12/18/2020   LDLCALC 136 (H) 12/18/2020   TRIG 69 12/18/2020   CHOLHDL 3.3 12/18/2020   Lab Results  Component Value Date   VD25OH 55.6 12/18/2020   VD25OH 21.9 (L) 08/08/2020   Lab Results  Component Value Date   WBC 4.9 08/08/2020   HGB 14.0 08/08/2020   HCT 43.8 08/08/2020   MCV 91 08/08/2020   PLT 268 08/08/2020   No results found for: IRON, TIBC, FERRITIN  Attestation Statements:   Reviewed by clinician on day of visit: allergies, medications, problem list, medical history, surgical history, family history, social history, and previous encounter notes.   Kara King, am acting as transcriptionist for [employer], DO.  I have reviewed the above documentation for accuracy and completeness, and I agree with the above. Kara King, D.O.  The [place] was signed into law in 2016 which includes the topic of electronic health records.  This provides immediate access to information in MyChart.  This includes consultation notes, operative notes, office notes, lab results and pathology reports.  If you have  any questions about what you read please let us know at your next visit so we can discuss your concerns and take corrective action if need be.  We are right here with you.

## 2021-05-13 ENCOUNTER — Other Ambulatory Visit: Payer: Self-pay | Admitting: Family Medicine

## 2021-05-13 DIAGNOSIS — G43719 Chronic migraine without aura, intractable, without status migrainosus: Secondary | ICD-10-CM

## 2021-05-22 ENCOUNTER — Other Ambulatory Visit: Payer: Self-pay

## 2021-05-22 ENCOUNTER — Encounter (INDEPENDENT_AMBULATORY_CARE_PROVIDER_SITE_OTHER): Payer: Self-pay | Admitting: Family Medicine

## 2021-05-22 ENCOUNTER — Ambulatory Visit (INDEPENDENT_AMBULATORY_CARE_PROVIDER_SITE_OTHER): Payer: 59 | Admitting: Family Medicine

## 2021-05-22 VITALS — BP 110/72 | HR 72 | Ht 65.0 in | Wt 193.0 lb

## 2021-05-22 DIAGNOSIS — R632 Polyphagia: Secondary | ICD-10-CM | POA: Diagnosis not present

## 2021-05-22 DIAGNOSIS — Z9189 Other specified personal risk factors, not elsewhere classified: Secondary | ICD-10-CM

## 2021-05-22 DIAGNOSIS — E559 Vitamin D deficiency, unspecified: Secondary | ICD-10-CM

## 2021-05-22 DIAGNOSIS — Z6839 Body mass index (BMI) 39.0-39.9, adult: Secondary | ICD-10-CM

## 2021-05-22 MED ORDER — PHENTERMINE HCL 37.5 MG PO TABS: ORAL_TABLET | ORAL | 0 refills | Status: DC

## 2021-05-22 MED ORDER — VITAMIN D (ERGOCALCIFEROL) 1.25 MG (50000 UNIT) PO CAPS: 50000.0000 [IU] | ORAL_CAPSULE | ORAL | 0 refills | Status: DC

## 2021-05-26 NOTE — Progress Notes (Signed)
Chief Complaint:   OBESITY Kara King is here to discuss her progress with her obesity treatment plan along with follow-up of her obesity related diagnoses. Kara King is on keeping a food journal and adhering to recommended goals of 1000-1100 calories and 85+ grams of protein daily and states she is following her eating plan approximately 95% of the time. Kara King states she is doing [employer] for 45 minutes 3-4 times per week.  Today's visit was #: 15 Starting weight: 240 lbs Starting date: 08/08/2020 Today's weight: 193 lbs Today's date: 05/22/2021 Total lbs lost to date: 44 Total lbs lost since last in-office visit: 3  Interim History: Kara King is here for a follow up office visit. We reviewed her meal plan and questions were answered. Patient's food recall appears to be accurate and consistent with what is on plan when she is following it. When eating on plan, her hunger and cravings are well controlled. We also discussed with Kara King that when she started the program with [place] she was almost 50 lbs more. Thus we will decrease the Category of her mel plan to account for this large weight loss.       Subjective:   1. Polyphagia Kara King denies hunger, but she notes some cravings and the desire to eat in the afternoons. She is sleeping well and has no issues with her medications.  2. Vitamin D deficiency Kara King is currently taking prescription vitamin D 50,000 IU each week. She denies nausea, vomiting or muscle weakness.  3. At risk for adverse drug reaction Kara King is at risk for drug side effects due to increasing her dose of phentermine.  Assessment/Plan:  No orders of the defined types were placed in this encounter.   Medications Discontinued During This Encounter  Medication Reason   phentermine (ADIPEX-P) 37.5 MG tablet Reorder   Vitamin D, Ergocalciferol, (DRISDOL) 1.25 MG (50000 UNIT) CAPS capsule Reorder     Meds ordered this encounter   Medications   phentermine (ADIPEX-P) 37.5 MG tablet    Sig: 1 tab po q am    Dispense:  30 tablet    Refill:  0    Needs OV for RF   Vitamin D, Ergocalciferol, (DRISDOL) 1.25 MG (50000 UNIT) CAPS capsule    Sig: Take 1 capsule (50,000 Units total) by mouth every 7 (seven) days.    Dispense:  4 capsule    Refill:  0    Ov for rf     1. Polyphagia Intensive lifestyle modifications are the first line treatment for this issue. Kara King agreed to increase Adipex to 37.5 mg q daily with no refills. Strategies were discussed with the patient today to help her with emotional cravings. She will continue to work on diet, exercise and weight loss efforts. Orders and follow up as documented in patient record.  Counseling Polyphagia is excessive hunger. Causes can include: low blood sugars, hypERthyroidism, PMS, lack of sleep, stress, insulin resistance, diabetes, certain medications, and diets that are deficient in protein and fiber.   - phentermine (ADIPEX-P) 37.5 MG tablet; 1 tab po q am  Dispense: 30 tablet; Refill: 0  2. Vitamin D deficiency Low Vitamin D level contributes to fatigue and are associated with obesity, breast, and colon cancer. We will refill prescription Vitamin D 50,000 IU every week for 1 month. Kara King will follow-up for routine testing of Vitamin D, at least 2-3 times per year to avoid over-replacement.  - Vitamin D, Ergocalciferol, (DRISDOL) 1.25 MG (50000 UNIT) CAPS  capsule; Take 1 capsule (50,000 Units total) by mouth every 7 (seven) days.  Dispense: 4 capsule; Refill: 0  3. At risk for adverse drug reaction Kara King was given approximately 10 minutes of drug side effect counseling today.  We discussed side effect possibility and risk versus benefits. Kara King agreed to the medication and will contact this office if these side effects are intolerable.  Repetitive spaced learning was employed today to elicit superior memory formation and behavioral change.  4.  Obesityt with current BMI of 32.1 Kara King is currently in the action stage of change. As such, her goal is to continue with weight loss efforts. She has agreed to change to the Category 1 Plan with protein equivalents.   Kara King agreed to increase her Adipex dose. We will consider rechecking her IC in the near future.  Exercise goals: As is.  Behavioral modification strategies: meal planning and cooking strategies, ways to avoid boredom eating, and emotional eating strategies.  Kara King has agreed to follow-up with our clinic in 2 to 3 weeks. She was informed of the importance of frequent follow-up visits to maximize her success with intensive lifestyle modifications for her multiple health conditions.   Objective:   Blood pressure 110/72, pulse 72, height 5\' 5"  (1.651 m), weight 193 lb (87.5 kg), last menstrual period 09/30/2016, SpO2 100 %. Body mass index is 32.12 kg/m.  General: Cooperative, alert, well developed, in no acute distress. HEENT: Conjunctivae and lids unremarkable. Cardiovascular: Regular rhythm.  Lungs: Normal work of breathing. Neurologic: No focal deficits.   Lab Results  Component Value Date   CREATININE 0.75 12/18/2020   BUN 16 12/18/2020   NA 146 (H) 12/18/2020   K 4.4 12/18/2020   CL 110 (H) 12/18/2020   CO2 22 12/18/2020   Lab Results  Component Value Date   ALT 18 12/18/2020   AST 23 12/18/2020   ALKPHOS 81 12/18/2020   BILITOT 0.4 12/18/2020   Lab Results  Component Value Date   HGBA1C 5.6 08/08/2020   Lab Results  Component Value Date   INSULIN 4.5 08/08/2020   Lab Results  Component Value Date   TSH 1.450 08/08/2020   Lab Results  Component Value Date   CHOL 213 (H) 12/18/2020   HDL 65 12/18/2020   LDLCALC 136 (H) 12/18/2020   TRIG 69 12/18/2020   CHOLHDL 3.3 12/18/2020   Lab Results  Component Value Date   VD25OH 55.6 12/18/2020   VD25OH 21.9 (L) 08/08/2020   Lab Results  Component Value Date   WBC 4.9 08/08/2020    HGB 14.0 08/08/2020   HCT 43.8 08/08/2020   MCV 91 08/08/2020   PLT 268 08/08/2020   No results found for: IRON, TIBC, FERRITIN  Attestation Statements:   Reviewed by clinician on day of visit: allergies, medications, problem list, medical history, surgical history, family history, social history, and previous encounter notes.   Wilhemena Durie, am acting as transcriptionist for [employer], DO.  I have reviewed the above documentation for accuracy and completeness, and I agree with the above. Marjory Sneddon, D.O.  The [place] was signed into law in 2016 which includes the topic of electronic health records.  This provides immediate access to information in MyChart.  This includes consultation notes, operative notes, office notes, lab results and pathology reports.  If you have any questions about what you read please let us know at your next visit so we can discuss your concerns and take corrective  action if need be.  We are right here with you.

## 2021-06-16 ENCOUNTER — Other Ambulatory Visit: Payer: Self-pay

## 2021-06-16 ENCOUNTER — Encounter (INDEPENDENT_AMBULATORY_CARE_PROVIDER_SITE_OTHER): Payer: Self-pay | Admitting: Family Medicine

## 2021-06-16 ENCOUNTER — Ambulatory Visit (INDEPENDENT_AMBULATORY_CARE_PROVIDER_SITE_OTHER): Payer: 59 | Admitting: Family Medicine

## 2021-06-16 VITALS — BP 110/69 | HR 60 | Temp 97.6°F | Ht 65.0 in | Wt 191.0 lb

## 2021-06-16 DIAGNOSIS — E559 Vitamin D deficiency, unspecified: Secondary | ICD-10-CM | POA: Diagnosis not present

## 2021-06-16 DIAGNOSIS — E7849 Other hyperlipidemia: Secondary | ICD-10-CM

## 2021-06-16 DIAGNOSIS — E870 Hyperosmolality and hypernatremia: Secondary | ICD-10-CM

## 2021-06-16 DIAGNOSIS — R632 Polyphagia: Secondary | ICD-10-CM | POA: Diagnosis not present

## 2021-06-16 DIAGNOSIS — Z9189 Other specified personal risk factors, not elsewhere classified: Secondary | ICD-10-CM | POA: Diagnosis not present

## 2021-06-16 DIAGNOSIS — Z6839 Body mass index (BMI) 39.0-39.9, adult: Secondary | ICD-10-CM

## 2021-06-16 MED ORDER — PHENTERMINE HCL 37.5 MG PO TABS: ORAL_TABLET | ORAL | 0 refills | Status: DC

## 2021-06-16 MED ORDER — VITAMIN D (ERGOCALCIFEROL) 1.25 MG (50000 UNIT) PO CAPS: 50000.0000 [IU] | ORAL_CAPSULE | ORAL | 0 refills | Status: DC

## 2021-06-16 NOTE — Patient Instructions (Signed)
The 10-year ASCVD risk score (Arnett DK, et al., 2019) is: 1.4%   Values used to calculate the score:     Age: 55 years     Sex: Female     Is Non-Hispanic African American: No     Diabetic: No     Tobacco smoker: No     Systolic Blood Pressure: 876 mmHg     Is BP treated: No     HDL Cholesterol: 65 mg/dL     Total Cholesterol: 213 mg/dL

## 2021-06-17 NOTE — Progress Notes (Signed)
Chief Complaint:   OBESITY Kara King is here to discuss her progress with her obesity treatment plan along with follow-up of her obesity related diagnoses. Kara King is on the Category 1 Plan and states she is following her eating plan approximately 80% of the time. Kara King states she is exercising with [employer] for 45 minutes 4 times per week.  Today's visit was #: 16 Starting weight: 240 lbs Starting date: 08/08/2020 Today's weight: 191 lbs Today's date: 06/16/2021 Total lbs lost to date: 58 Total lbs lost since last in-office visit: 2  Interim History: Kara King is here for a follow up office visit. We reviewed her meal plan and questions were answered. Patient's food recall appears to be accurate and consistent with what is on plan when she is following it. When eating on plan, her hunger and cravings are well controlled.  Kara King Kara King gained weight over Thanksgiving Day, approximately 4-5 lbs and she did really well getting right back on the plan.     Subjective:   1. Polyphagia Kara King is tolerating phentermine will at 1/2 tablet q daily. She notes she got a headache with 1 full tablet daily, as she was unable to tolerate.  2. Other hyperlipidemia Kara King has hyperlipidemia and has been trying to improve her cholesterol levels with intensive lifestyle modification including a low saturated fat diet, exercise and weight loss. She denies any chest pain, claudication or myalgias.  3. Hypernatremia Kara King is due to have her labs checked soon.   4. Vitamin D deficiency Kara King is currently taking prescription vitamin D 50,000 IU each week. She denies nausea, vomiting or muscle weakness.  5. At risk for impaired metabolic function Kara King is at increased risk for impaired metabolic function due to obesity.    Assessment/Plan:   Orders Placed This Encounter  Procedures   Insulin, random   Hemoglobin A1c   Lipid Panel With LDL/HDL Ratio    Basic Metabolic Panel (BMET)   VITAMIN D 25 Hydroxy (Vit-D Deficiency, Fractures)    Medications Discontinued During This Encounter  Medication Reason   phentermine (ADIPEX-P) 37.5 MG tablet    Vitamin D, Ergocalciferol, (DRISDOL) 1.25 MG (50000 UNIT) CAPS capsule Reorder     Meds ordered this encounter  Medications   Vitamin D, Ergocalciferol, (DRISDOL) 1.25 MG (50000 UNIT) CAPS capsule    Sig: Take 1 capsule (50,000 Units total) by mouth every 7 (seven) days.    Dispense:  4 capsule    Refill:  0    Ov for rf   phentermine (ADIPEX-P) 37.5 MG tablet    Sig: 1/2 tab po q am    Dispense:  30 tablet    Refill:  0    Needs OV for RF     1. Polyphagia Intensive lifestyle modifications are the first line treatment for this issue. We discussed several lifestyle modifications today. Kara King will continue phentermine at 1/2 tablet q daily, and we will refill for 1 month. We will recheck fasting insulin and A1c at her next office visit. She will continue to work on diet, exercise and weight loss efforts. Orders and follow up as documented in patient record.  Counseling Polyphagia is excessive hunger. Causes can include: low blood sugars, hyperthyroidism, PMS, lack of sleep, stress, insulin resistance, diabetes, certain medications, and diets that are deficient in protein and fiber.   - phentermine (ADIPEX-P) 37.5 MG tablet; 1/2 tab po q am  Dispense: 30 tablet; Refill: 0 - Insulin, random - Hemoglobin A1c  2. Other hyperlipidemia Cardiovascular risk and specific lipid/LDL goals reviewed. We discussed several lifestyle modifications today. We will recheck FLP at her next office visit. Kara King will continue to work on diet, exercise and weight loss efforts. Orders and follow up as documented in patient record.   Counseling Intensive lifestyle modifications are the first line treatment for this issue. Dietary changes: Increase soluble fiber. Decrease simple carbohydrates. Exercise  changes: Moderate to vigorous-intensity aerobic activity 150 minutes per week if tolerated. Lipid-lowering medications: see documented in medical record.  - Lipid Panel With LDL/HDL Ratio  3. Hypernatremia We will check BMP at Kara King next office visit.  - Basic Metabolic Panel (BMET)  4. Vitamin D deficiency We will refill prescription Vitamin D 50,000 IU every week for 1 month. We will recheck Vit D level at her next office visit. Kara King will follow-up for routine testing of Vitamin D, at least 2-3 times per year to avoid over-replacement.  - Vitamin D, Ergocalciferol, (DRISDOL) 1.25 MG (50000 UNIT) CAPS capsule; Take 1 capsule (50,000 Units total) by mouth every 7 (seven) days.  Dispense: 4 capsule; Refill: 0 - VITAMIN D 25 Hydroxy (Vit-D Deficiency, Fractures)  5. At risk for impaired metabolic function Kara King was given approximately 9 minutes of impaired  metabolic function prevention counseling today. We discussed intensive lifestyle modifications today with an emphasis on specific nutrition and exercise instructions and strategies.   Repetitive spaced learning was employed today to elicit superior memory formation and behavioral change.  6. Obesity with current BMI of 31.8 Kara King is currently in the action stage of change. As such, her goal is to continue with weight loss efforts. She has agreed to the Category 1 Plan.   Kara King is to come fasting for labs to her next office visit.  Exercise goals: As is.  Behavioral modification strategies: avoiding temptations and planning for success.  Kara King has agreed to follow-up with our clinic in 2 to 3 weeks. She was informed of the importance of frequent follow-up visits to maximize her success with intensive lifestyle modifications for her multiple health conditions.   Objective:   Blood pressure 110/69, pulse 60, temperature 97.6 F (36.4 C), height 5\' 5"  (1.651 m), weight 191 lb (86.6 kg), last menstrual period  09/30/2016, SpO2 98 %. Body mass index is 31.78 kg/m.  General: Cooperative, alert, well developed, in no acute distress. HEENT: Conjunctivae and lids unremarkable. Cardiovascular: Regular rhythm.  Lungs: Normal work of breathing. Neurologic: No focal deficits.   Lab Results  Component Value Date   CREATININE 0.75 12/18/2020   BUN 16 12/18/2020   NA 146 (H) 12/18/2020   K 4.4 12/18/2020   CL 110 (H) 12/18/2020   CO2 22 12/18/2020   Lab Results  Component Value Date   ALT 18 12/18/2020   AST 23 12/18/2020   ALKPHOS 81 12/18/2020   BILITOT 0.4 12/18/2020   Lab Results  Component Value Date   HGBA1C 5.6 08/08/2020   Lab Results  Component Value Date   INSULIN 4.5 08/08/2020   Lab Results  Component Value Date   TSH 1.450 08/08/2020   Lab Results  Component Value Date   CHOL 213 (H) 12/18/2020   HDL 65 12/18/2020   LDLCALC 136 (H) 12/18/2020   TRIG 69 12/18/2020   CHOLHDL 3.3 12/18/2020   Lab Results  Component Value Date   VD25OH 55.6 12/18/2020   VD25OH 21.9 (L) 08/08/2020   Lab Results  Component Value Date   WBC 4.9 08/08/2020   HGB  14.0 08/08/2020   HCT 43.8 08/08/2020   MCV 91 08/08/2020   PLT 268 08/08/2020   No results found for: IRON, TIBC, FERRITIN  Attestation Statements:   Reviewed by clinician on day of visit: allergies, medications, problem list, medical history, surgical history, family history, social history, and previous encounter notes.   Wilhemena Durie, am acting as transcriptionist for [employer], DO.  I have reviewed the above documentation for accuracy and completeness, and I agree with the above. Marjory Sneddon, D.O.  The [place] was signed into law in 2016 which includes the topic of electronic health records.  This provides immediate access to information in MyChart.  This includes consultation notes, operative notes, office notes, lab results and pathology reports.  If you have any questions about  what you read please let us know at your next visit so we can discuss your concerns and take corrective action if need be.  We are right here with you.

## 2021-07-03 ENCOUNTER — Ambulatory Visit (INDEPENDENT_AMBULATORY_CARE_PROVIDER_SITE_OTHER): Payer: 59 | Admitting: Family Medicine

## 2021-07-03 ENCOUNTER — Other Ambulatory Visit: Payer: Self-pay

## 2021-07-03 ENCOUNTER — Encounter (INDEPENDENT_AMBULATORY_CARE_PROVIDER_SITE_OTHER): Payer: Self-pay | Admitting: Family Medicine

## 2021-07-03 VITALS — BP 115/70 | HR 50 | Ht 65.0 in | Wt 188.0 lb

## 2021-07-03 DIAGNOSIS — Z6831 Body mass index (BMI) 31.0-31.9, adult: Secondary | ICD-10-CM

## 2021-07-03 DIAGNOSIS — K5909 Other constipation: Secondary | ICD-10-CM

## 2021-07-03 DIAGNOSIS — E559 Vitamin D deficiency, unspecified: Secondary | ICD-10-CM | POA: Diagnosis not present

## 2021-07-03 DIAGNOSIS — Z9189 Other specified personal risk factors, not elsewhere classified: Secondary | ICD-10-CM | POA: Diagnosis not present

## 2021-07-03 MED ORDER — VITAMIN D (ERGOCALCIFEROL) 1.25 MG (50000 UNIT) PO CAPS: 50000.0000 [IU] | ORAL_CAPSULE | ORAL | 0 refills | Status: DC

## 2021-07-03 NOTE — Progress Notes (Signed)
Chief Complaint:   OBESITY Kara King is here to discuss her progress with her obesity treatment plan along with follow-up of her obesity related diagnoses. Kara King is on the Category 1 Plan and states she is following her eating plan approximately 95% of the time. Kara King states she is doing boot camp for 45 minutes 4 times per week.  Today's visit was #: 17 Starting weight: 240 lbs Starting date: 08/08/2020 Today's weight: 188 lbs Today's date: 07/03/2021 Total lbs lost to date: 109 Total lbs lost since last in-office visit: 3  Interim History: Kara King is here for a follow up office visit. We reviewed her meal plan and questions were answered. Patient's food recall appears to be accurate and consistent with what is on plan when she is following it. When eating on plan, her hunger and cravings are well controlled. Kara King is working out and she has no concerns with the upcoming holidays. She desires to lose weight during that time.  Subjective:   1. Vitamin D deficiency Kara King is currently taking prescription vitamin D 50,000 IU each week. She denies nausea, vomiting or muscle weakness.  2. Other constipation Kara King was using miralax and CALM supplementations regularly, but now her symptoms are under good control. No need for help with bowels.  3. At risk for colon cancer Kara King is at risk for colon cancer due to never getting a screening for the disease.  Assessment/Plan:  No orders of the defined types were placed in this encounter.   Medications Discontinued During This Encounter  Medication Reason   Vitamin D, Ergocalciferol, (DRISDOL) 1.25 MG (50000 UNIT) CAPS capsule Reorder     Meds ordered this encounter  Medications   Vitamin D, Ergocalciferol, (DRISDOL) 1.25 MG (50000 UNIT) CAPS capsule    Sig: Take 1 capsule (50,000 Units total) by mouth every 7 (seven) days.    Dispense:  4 capsule    Refill:  0    Ov for rf     1. Vitamin D  deficiency Low Vitamin D level contributes to fatigue and are associated with obesity, breast, and colon cancer. We will refill prescription Vitamin D for 1 month. Kara King will follow-up for routine testing of Vitamin D, at least 2-3 times per year to avoid over-replacement.  - Vitamin D, Ergocalciferol, (DRISDOL) 1.25 MG (50000 UNIT) CAPS capsule; Take 1 capsule (50,000 Units total) by mouth every 7 (seven) days.  Dispense: 4 capsule; Refill: 0  2. Other constipation Kara King is to go for a colonoscopy in the near future. She needs to call for an appointment, as she never had an exam.  3. At risk for colon cancer Kara King was given approximately 15 minutes of counseling of the risk for colon cancer. She is 55 y.o. female and has risk factors for colon cancer. We discussed intensive lifestyle modifications today with an emphasis on specific weight loss instructions and strategies. She will call for and appointment in the near future.  Repetitive spaced learning was employed today to elicit superior memory formation and behavioral change.  4. Obesity with current BMI of 31.4 Kara King is currently in the action stage of change. As such, her goal is to continue with weight loss efforts. She has agreed to the Category 1 Plan.   Exercise goals: As is.  Behavioral modification strategies: holiday eating strategies , avoiding temptations, and planning for success.  Kara King has agreed to follow-up with our clinic in 3 to 4 weeks. She was informed of the importance of  frequent follow-up visits to maximize her success with intensive lifestyle modifications for her multiple health conditions.   Objective:   Blood pressure 115/70, pulse (!) 50, height 5\' 5"  (1.651 m), weight 188 lb (85.3 kg), last menstrual period 09/30/2016, SpO2 100 %. Body mass index is 31.28 kg/m.  General: Cooperative, alert, well developed, in no acute distress. HEENT: Conjunctivae and lids  unremarkable. Cardiovascular: Regular rhythm.  Lungs: Normal work of breathing. Neurologic: No focal deficits.   Lab Results  Component Value Date   CREATININE 0.75 12/18/2020   BUN 16 12/18/2020   NA 146 (H) 12/18/2020   K 4.4 12/18/2020   CL 110 (H) 12/18/2020   CO2 22 12/18/2020   Lab Results  Component Value Date   ALT 18 12/18/2020   AST 23 12/18/2020   ALKPHOS 81 12/18/2020   BILITOT 0.4 12/18/2020   Lab Results  Component Value Date   HGBA1C 5.6 08/08/2020   Lab Results  Component Value Date   INSULIN 4.5 08/08/2020   Lab Results  Component Value Date   TSH 1.450 08/08/2020   Lab Results  Component Value Date   CHOL 213 (H) 12/18/2020   HDL 65 12/18/2020   LDLCALC 136 (H) 12/18/2020   TRIG 69 12/18/2020   CHOLHDL 3.3 12/18/2020   Lab Results  Component Value Date   VD25OH 55.6 12/18/2020   VD25OH 21.9 (L) 08/08/2020   Lab Results  Component Value Date   WBC 4.9 08/08/2020   HGB 14.0 08/08/2020   HCT 43.8 08/08/2020   MCV 91 08/08/2020   PLT 268 08/08/2020   No results found for: IRON, TIBC, FERRITIN  Attestation Statements:   Reviewed by clinician on day of visit: allergies, medications, problem list, medical history, surgical history, family history, social history, and previous encounter notes.   Wilhemena Durie, am acting as transcriptionist for [employer], DO.  I have reviewed the above documentation for accuracy and completeness, and I agree with the above. Marjory Sneddon, D.O.  The [place] was signed into law in 2016 which includes the topic of electronic health records.  This provides immediate access to information in MyChart.  This includes consultation notes, operative notes, office notes, lab results and pathology reports.  If you have any questions about what you read please let us know at your next visit so we can discuss your concerns and take corrective action if need be.  We are right here with  you.

## 2021-07-04 LAB — HEMOGLOBIN A1C
Est. average glucose Bld gHb Est-mCnc: 111 mg/dL
Hgb A1c MFr Bld: 5.5 % (ref 4.8–5.6)

## 2021-07-04 LAB — VITAMIN D 25 HYDROXY (VIT D DEFICIENCY, FRACTURES): Vit D, 25-Hydroxy: 71.9 ng/mL (ref 30.0–100.0)

## 2021-07-04 LAB — LIPID PANEL WITH LDL/HDL RATIO
Cholesterol, Total: 191 mg/dL (ref 100–199)
HDL: 70 mg/dL (ref >39)
LDL Chol Calc (NIH): 112 mg/dL — ABNORMAL HIGH (ref 0–99)
LDL/HDL Ratio: 1.6 ratio (ref 0.0–3.2)
Triglycerides: 49 mg/dL (ref 0–149)
VLDL Cholesterol Cal: 9 mg/dL (ref 5–40)

## 2021-07-04 LAB — BASIC METABOLIC PANEL
BUN/Creatinine Ratio: 25 — ABNORMAL HIGH (ref 9–23)
BUN: 17 mg/dL (ref 6–24)
CO2: 23 mmol/L (ref 20–29)
Calcium: 9.1 mg/dL (ref 8.7–10.2)
Chloride: 105 mmol/L (ref 96–106)
Creatinine, Ser: 0.67 mg/dL (ref 0.57–1.00)
Glucose: 82 mg/dL (ref 70–99)
Potassium: 4.3 mmol/L (ref 3.5–5.2)
Sodium: 142 mmol/L (ref 134–144)
eGFR: 103 mL/min/{1.73_m2} (ref >59)

## 2021-07-04 LAB — INSULIN, RANDOM: INSULIN: 2.2 u[IU]/mL — ABNORMAL LOW (ref 2.6–24.9)

## 2021-07-11 ENCOUNTER — Encounter: Payer: Self-pay | Admitting: Gastroenterology

## 2021-07-18 ENCOUNTER — Other Ambulatory Visit (INDEPENDENT_AMBULATORY_CARE_PROVIDER_SITE_OTHER): Payer: Self-pay | Admitting: Family Medicine

## 2021-07-18 ENCOUNTER — Encounter (INDEPENDENT_AMBULATORY_CARE_PROVIDER_SITE_OTHER): Payer: Self-pay | Admitting: Family Medicine

## 2021-07-18 DIAGNOSIS — R632 Polyphagia: Secondary | ICD-10-CM

## 2021-07-21 NOTE — Telephone Encounter (Signed)
LOV w/ Dr. Jenetta Downer

## 2021-07-27 ENCOUNTER — Other Ambulatory Visit (INDEPENDENT_AMBULATORY_CARE_PROVIDER_SITE_OTHER): Payer: Self-pay | Admitting: Family Medicine

## 2021-07-27 DIAGNOSIS — R632 Polyphagia: Secondary | ICD-10-CM

## 2021-07-28 ENCOUNTER — Other Ambulatory Visit: Payer: Self-pay

## 2021-07-28 ENCOUNTER — Encounter (INDEPENDENT_AMBULATORY_CARE_PROVIDER_SITE_OTHER): Payer: Self-pay | Admitting: Family Medicine

## 2021-07-28 ENCOUNTER — Ambulatory Visit (INDEPENDENT_AMBULATORY_CARE_PROVIDER_SITE_OTHER): Payer: 59 | Admitting: Family Medicine

## 2021-07-28 VITALS — BP 130/73 | HR 51 | Temp 97.5°F | Ht 65.0 in | Wt 187.0 lb

## 2021-07-28 DIAGNOSIS — R632 Polyphagia: Secondary | ICD-10-CM

## 2021-07-28 DIAGNOSIS — Z9189 Other specified personal risk factors, not elsewhere classified: Secondary | ICD-10-CM

## 2021-07-28 DIAGNOSIS — E860 Dehydration: Secondary | ICD-10-CM

## 2021-07-28 DIAGNOSIS — Z6839 Body mass index (BMI) 39.0-39.9, adult: Secondary | ICD-10-CM | POA: Insufficient documentation

## 2021-07-28 DIAGNOSIS — E559 Vitamin D deficiency, unspecified: Secondary | ICD-10-CM

## 2021-07-28 DIAGNOSIS — Z6831 Body mass index (BMI) 31.0-31.9, adult: Secondary | ICD-10-CM

## 2021-07-28 DIAGNOSIS — E669 Obesity, unspecified: Secondary | ICD-10-CM

## 2021-07-28 DIAGNOSIS — E7849 Other hyperlipidemia: Secondary | ICD-10-CM | POA: Diagnosis not present

## 2021-07-28 MED ORDER — PHENTERMINE HCL 37.5 MG PO TABS: ORAL_TABLET | ORAL | 0 refills | Status: DC

## 2021-07-28 MED ORDER — VITAMIN D (ERGOCALCIFEROL) 1.25 MG (50000 UNIT) PO CAPS: 50000.0000 [IU] | ORAL_CAPSULE | ORAL | 0 refills | Status: DC

## 2021-07-28 NOTE — Patient Instructions (Signed)
The 10-year ASCVD risk score (Arnett DK, et al., 2019) is: 1.6%   Values used to calculate the score:     Age: 56 years     Sex: Female     Is Non-Hispanic African American: No     Diabetic: No     Tobacco smoker: No     Systolic Blood Pressure: 136 mmHg     Is BP treated: No     HDL Cholesterol: 70 mg/dL     Total Cholesterol: 191 mg/dL

## 2021-07-29 NOTE — Progress Notes (Signed)
Chief Complaint:   OBESITY Kara King is here to discuss her progress with her obesity treatment plan along with follow-up of her obesity related diagnoses. Rease is on the Category 1 Plan and states she is following her eating plan approximately 85% of the time. Marylou states she is doing boot camp 45 minutes 4 times per week.  Today's visit was #: 18 Starting weight: 240 lbs Starting date: 08/08/2020 Today's weight: 187 lbs Today's date: 07/28/2021 Total lbs lost to date: 53 Total lbs lost since last in-office visit: 1  Interim History: Pt went about a week without her phentermine and has done great with losing 1 lb over the holidays. She even went to the gym the entire time over the holidays. She drinks 100 oz water per day. Pt denies issues with plan. She denies hunger or cravings.   Subjective:   1. Other hyperlipidemia Discussed labs with patient today. Improved LDL, HDL, and triglycerides. Medication: None  The 10-year ASCVD risk score (Arnett DK, et al., 2019) is: 1.6%   Values used to calculate the score:     Age: 56 years     Sex: Female     Is Non-Hispanic African American: No     Diabetic: No     Tobacco smoker: No     Systolic Blood Pressure: 938 mmHg     Is BP treated: No     HDL Cholesterol: 70 mg/dL     Total Cholesterol: 191 mg/dL  2. Vitamin D deficiency Discussed labs with patient today. She is currently taking prescription vitamin D 50,000 IU each week. She denies nausea, vomiting or muscle weakness.  3. Polyphagia Discussed labs with patient today. Pt reports phentermine helps with snacking. She had very little symptoms of agitation for 2 days when she stopped meds, otherwise no issues.  4. Mild dehydration Discussed labs with patient today. Pt has been working on decreasing salt intake and increasing water intake.  5. At risk for heart disease Gayle is at a higher than average risk for cardiovascular disease due to obesity and  hyperlipidemia, but her risk has actually decreased since being in the program.   Assessment/Plan:  No orders of the defined types were placed in this encounter.   Medications Discontinued During This Encounter  Medication Reason   UNABLE TO FIND    UNABLE TO FIND    phentermine (ADIPEX-P) 37.5 MG tablet Reorder   Vitamin D, Ergocalciferol, (DRISDOL) 1.25 MG (50000 UNIT) CAPS capsule Reorder     Meds ordered this encounter  Medications   phentermine (ADIPEX-P) 37.5 MG tablet    Sig: 1/2 tab po q am    Dispense:  30 tablet    Refill:  0    Needs OV for RF   Vitamin D, Ergocalciferol, (DRISDOL) 1.25 MG (50000 UNIT) CAPS capsule    Sig: Take 1 capsule (50,000 Units total) by mouth every 7 (seven) days.    Dispense:  4 capsule    Refill:  0    Ov for rf     1. Other hyperlipidemia Improving LDL and improved HDL and triglycerides with lifestyle and dietary changes made. Cardiovascular risk and specific lipid/LDL goals reviewed.  We discussed several lifestyle modifications today and Lanitra will continue to work on diet, exercise and weight loss efforts. Orders and follow up as documented in patient record.   Counseling Intensive lifestyle modifications are the first line treatment for this issue. Dietary changes: Increase soluble fiber. Decrease simple carbohydrates. Exercise  changes: Moderate to vigorous-intensity aerobic activity 150 minutes per week if tolerated. Lipid-lowering medications: see documented in medical record.  2. Vitamin D deficiency Vit D at goal. We will decrease dose at the end of March/early April to 1 every 10 days when she is out int he sun more often. Low Vitamin D level contributes to fatigue and are associated with obesity, breast, and colon cancer. She agrees to continue to take prescription Vitamin D 50,000 IU every week and will follow-up for routine testing of Vitamin D, at least 2-3 times per year to avoid over-replacement.  Refill- Vitamin D,  Ergocalciferol, (DRISDOL) 1.25 MG (50000 UNIT) CAPS capsule; Take 1 capsule (50,000 Units total) by mouth every 7 (seven) days.  Dispense: 4 capsule; Refill: 0  3. Polyphagia Intensive lifestyle modifications are the first line treatment for this issue. We discussed several lifestyle modifications today and she will continue to work on diet, exercise and weight loss efforts. Orders and follow up as documented in patient record. Continue current treatment plan.  Counseling Polyphagia is excessive hunger. Causes can include: low blood sugars, hypERthyroidism, PMS, lack of sleep, stress, insulin resistance, diabetes, certain medications, and diets that are deficient in protein and fiber.   Refill- phentermine (ADIPEX-P) 37.5 MG tablet; 1/2 tab po q am  Dispense: 30 tablet; Refill: 0  4. Mild dehydration BMP improved and now sodium level is within normal limits. Pt has done much better with water intake and she will continue.  5. At risk for heart disease Shaylan was given approximately 9 minutes of coronary artery disease prevention counseling today. She is 56 y.o. female and has risk factors for heart disease including obesity. We discussed intensive lifestyle modifications today with an emphasis on specific weight loss instructions and strategies.   Repetitive spaced learning was employed today to elicit superior memory formation and behavioral change.  6. Obesity with current BMI of 31.2  Giara is currently in the action stage of change. As such, her goal is to continue with weight loss efforts. She has agreed to the Category 1 Plan.   Exercise goals:  As is  Behavioral modification strategies: increasing lean protein intake and planning for success.  Shawneequa has agreed to follow-up with our clinic in 2-3 weeks. She was informed of the importance of frequent follow-up visits to maximize her success with intensive lifestyle modifications for her multiple health conditions.    Objective:   Blood pressure 130/73, pulse (!) 51, temperature (!) 97.5 F (36.4 C), height 5\' 5"  (1.651 m), weight 187 lb (84.8 kg), last menstrual period 09/30/2016, SpO2 100 %. Body mass index is 31.12 kg/m.  General: Cooperative, alert, well developed, in no acute distress. HEENT: Conjunctivae and lids unremarkable. Cardiovascular: Regular rhythm.  Lungs: Normal work of breathing. Neurologic: No focal deficits.   Lab Results  Component Value Date   CREATININE 0.67 07/03/2021   BUN 17 07/03/2021   NA 142 07/03/2021   K 4.3 07/03/2021   CL 105 07/03/2021   CO2 23 07/03/2021   Lab Results  Component Value Date   ALT 18 12/18/2020   AST 23 12/18/2020   ALKPHOS 81 12/18/2020   BILITOT 0.4 12/18/2020   Lab Results  Component Value Date   HGBA1C 5.5 07/03/2021   HGBA1C 5.6 08/08/2020   Lab Results  Component Value Date   INSULIN 2.2 (L) 07/03/2021   INSULIN 4.5 08/08/2020   Lab Results  Component Value Date   TSH 1.450 08/08/2020   Lab Results  Component Value Date   CHOL 191 07/03/2021   HDL 70 07/03/2021   LDLCALC 112 (H) 07/03/2021   TRIG 49 07/03/2021   CHOLHDL 3.3 12/18/2020   Lab Results  Component Value Date   VD25OH 71.9 07/03/2021   VD25OH 55.6 12/18/2020   VD25OH 21.9 (L) 08/08/2020   Lab Results  Component Value Date   WBC 4.9 08/08/2020   HGB 14.0 08/08/2020   HCT 43.8 08/08/2020   MCV 91 08/08/2020   PLT 268 08/08/2020   Attestation Statements:   Reviewed by clinician on day of visit: allergies, medications, problem list, medical history, surgical history, family history, social history, and previous encounter notes.  Coral Ceo, CMA, am acting as transcriptionist for [employer], DO.  I have reviewed the above documentation for accuracy and completeness, and I agree with the above. Marjory Sneddon, D.O.  The [place] was signed into law in 2016 which includes the topic of electronic health records.   This provides immediate access to information in MyChart.  This includes consultation notes, operative notes, office notes, lab results and pathology reports.  If you have any questions about what you read please let us know at your next visit so we can discuss your concerns and take corrective action if need be.  We are right here with you.

## 2021-08-13 ENCOUNTER — Other Ambulatory Visit: Payer: Self-pay

## 2021-08-13 ENCOUNTER — Encounter (INDEPENDENT_AMBULATORY_CARE_PROVIDER_SITE_OTHER): Payer: Self-pay | Admitting: Family Medicine

## 2021-08-13 ENCOUNTER — Ambulatory Visit (INDEPENDENT_AMBULATORY_CARE_PROVIDER_SITE_OTHER): Payer: 59 | Admitting: Family Medicine

## 2021-08-13 VITALS — BP 107/55 | HR 50 | Temp 97.9°F | Ht 65.0 in | Wt 189.0 lb

## 2021-08-13 DIAGNOSIS — R632 Polyphagia: Secondary | ICD-10-CM

## 2021-08-13 DIAGNOSIS — E559 Vitamin D deficiency, unspecified: Secondary | ICD-10-CM

## 2021-08-13 DIAGNOSIS — Z6831 Body mass index (BMI) 31.0-31.9, adult: Secondary | ICD-10-CM

## 2021-08-13 DIAGNOSIS — E669 Obesity, unspecified: Secondary | ICD-10-CM

## 2021-08-13 MED ORDER — VITAMIN D (ERGOCALCIFEROL) 1.25 MG (50000 UNIT) PO CAPS: ORAL_CAPSULE | ORAL | 0 refills | Status: DC

## 2021-08-13 MED ORDER — PHENTERMINE HCL 37.5 MG PO TABS: ORAL_TABLET | ORAL | 0 refills | Status: DC

## 2021-08-13 NOTE — Progress Notes (Signed)
Chief Complaint:   OBESITY Kara King is here to discuss her progress with her obesity treatment plan along with follow-up of her obesity related diagnoses. Kara King is on the Category 1 Plan and states she is following her eating plan approximately 50% of the time. Kara King states she is doing boot camp for 45 minutes 4 times per week.  Today's visit was #: 25 Starting weight: 240 lbs Starting date: 08/08/2020 Today's weight: 189 lbs Today's date: 08/13/2021 Total lbs lost to date: 51 Total lbs lost since last in-office visit: 0  Interim History: Kara King has no issues with the meal plan. She notes more hunger and snacking lately, especially due to increased stress in her life recently. She gained 2 lbs since last OV and has gained 4 lbs in water    Subjective:   1. Polyphagia Kara King feels like 1/2 tablet of phentermine QD has overall helped a lot, but recently she feels it is losing effectiveness.  She doesn't particularly want to take meds but it has helped and her insurance will not cover any other medications beyond the ones we have already tried- metformin, topiramate.    2. Vitamin D deficiency Kara King is currently taking prescription vitamin D 50,000 IU every 10 days. She denies nausea, vomiting or muscle weakness.     Assessment/Plan:  No orders of the defined types were placed in this encounter.   Medications Discontinued During This Encounter  Medication Reason   phentermine (ADIPEX-P) 37.5 MG tablet    Vitamin D, Ergocalciferol, (DRISDOL) 1.25 MG (50000 UNIT) CAPS capsule Reorder     Meds ordered this encounter  Medications   Vitamin D, Ergocalciferol, (DRISDOL) 1.25 MG (50000 UNIT) CAPS capsule    Sig: 1 po q 10 days  ( pt not given script 2/1, just dose change)    Dispense:  3 capsule    Refill:  0    Ov for rf   phentermine (ADIPEX-P) 37.5 MG tablet    Sig: 1 tab po q am ( pt not given script 2/1, just dose change)    Dispense:  30 tablet     Refill:  0    Needs OV for RF     1. Polyphagia Intensive lifestyle modifications are the first line treatment for this issue. We discussed several.    Kara King has 20 tablets or so left of phentermine.   PDMP reviewed.  Risks and benefits of medication discussed with pt today.  We will increase dose to 1 tablet PO daily (up from 1/2 tablet daily). No need for a refill today. Pt will continue to work on diet, exercise and weight loss efforts. Orders and follow up as documented in patient record.  Counseling Polyphagia is excessive hunger. Causes can include: low blood sugars, hyperthyroidism, PMS, lack of sleep, stress, insulin resistance, diabetes, certain medications, and diets that are deficient in protein and fiber.   - phentermine (ADIPEX-P) 37.5 MG tablet; 1 tab po q am ( pt not given script 2/1, just dose change)  Dispense: 30 tablet; Refill: 0   2. Vitamin D deficiency Kara King has been outside more and agrees to decrease prescription Vitamin D to 50,000 IU every 10 days (down from every 7 days).  No need for a refill today.  She will follow-up for routine testing of Vitamin D, at least 2-3 times per year to avoid over-replacement.  - Vitamin D, Ergocalciferol, (DRISDOL) 1.25 MG (50000 UNIT) CAPS capsule; 1 po q 10 days  ( pt not  given script today, just dose change)  Dispense: 3 capsule; Refill: 0   3. Obesity with current BMI of 31.5 Kara King is currently in the action stage of change. As such, her goal is to continue with weight loss efforts. She has agreed to the Category 1 Plan.   Exercise goals: As is.  Behavioral modification strategies: better snacking choices, emotional eating strategies, and planning for success.  Kara King has agreed to follow-up with our clinic in 2 to 3 weeks. She was informed of the importance of frequent follow-up visits to maximize her success with intensive lifestyle modifications for her multiple health conditions.    Objective:   Blood  pressure (!) 107/55, pulse (!) 50, temperature 97.9 F (36.6 C), height 5\' 5"  (1.651 m), weight 189 lb (85.7 kg), last menstrual period 09/30/2016, SpO2 100 %. Body mass index is 31.45 kg/m.  General: Cooperative, alert, well developed, in no acute distress. HEENT: Conjunctivae and lids unremarkable. Cardiovascular: Regular rhythm.  Lungs: Normal work of breathing. Neurologic: No focal deficits.   Lab Results  Component Value Date   CREATININE 0.67 07/03/2021   BUN 17 07/03/2021   NA 142 07/03/2021   K 4.3 07/03/2021   CL 105 07/03/2021   CO2 23 07/03/2021   Lab Results  Component Value Date   ALT 18 12/18/2020   AST 23 12/18/2020   ALKPHOS 81 12/18/2020   BILITOT 0.4 12/18/2020   Lab Results  Component Value Date   HGBA1C 5.5 07/03/2021   HGBA1C 5.6 08/08/2020   Lab Results  Component Value Date   INSULIN 2.2 (L) 07/03/2021   INSULIN 4.5 08/08/2020   Lab Results  Component Value Date   TSH 1.450 08/08/2020   Lab Results  Component Value Date   CHOL 191 07/03/2021   HDL 70 07/03/2021   LDLCALC 112 (H) 07/03/2021   TRIG 49 07/03/2021   CHOLHDL 3.3 12/18/2020   Lab Results  Component Value Date   VD25OH 71.9 07/03/2021   VD25OH 55.6 12/18/2020   VD25OH 21.9 (L) 08/08/2020   Lab Results  Component Value Date   WBC 4.9 08/08/2020   HGB 14.0 08/08/2020   HCT 43.8 08/08/2020   MCV 91 08/08/2020   PLT 268 08/08/2020   No results found for: IRON, TIBC, FERRITIN  Attestation Statements:   Reviewed by clinician on day of visit: allergies, medications, problem list, medical history, surgical history, family history, social history, and previous encounter notes.  Wilhemena Durie, am acting as transcriptionist for [employer], DO.  I have reviewed the above documentation for accuracy and completeness, and I agree with the above. Marjory Sneddon, D.O.  The [place] was signed into law in 2016 which includes the topic of electronic  health records.  This provides immediate access to information in MyChart.  This includes consultation notes, operative notes, office notes, lab results and pathology reports.  If you have any questions about what you read please let us know at your next visit so we can discuss your concerns and take corrective action if need be.  We are right here with you.

## 2021-08-25 ENCOUNTER — Other Ambulatory Visit: Payer: Self-pay

## 2021-08-25 ENCOUNTER — Encounter (INDEPENDENT_AMBULATORY_CARE_PROVIDER_SITE_OTHER): Payer: Self-pay | Admitting: Physician Assistant

## 2021-08-25 ENCOUNTER — Ambulatory Visit (INDEPENDENT_AMBULATORY_CARE_PROVIDER_SITE_OTHER): Payer: 59 | Admitting: Physician Assistant

## 2021-08-25 VITALS — BP 98/61 | HR 53 | Temp 97.7°F | Ht 65.0 in | Wt 186.0 lb

## 2021-08-25 DIAGNOSIS — R632 Polyphagia: Secondary | ICD-10-CM

## 2021-08-25 DIAGNOSIS — Z6830 Body mass index (BMI) 30.0-30.9, adult: Secondary | ICD-10-CM | POA: Diagnosis not present

## 2021-08-25 DIAGNOSIS — E559 Vitamin D deficiency, unspecified: Secondary | ICD-10-CM

## 2021-08-25 DIAGNOSIS — E669 Obesity, unspecified: Secondary | ICD-10-CM | POA: Diagnosis not present

## 2021-08-25 DIAGNOSIS — Z9189 Other specified personal risk factors, not elsewhere classified: Secondary | ICD-10-CM

## 2021-08-25 DIAGNOSIS — Z6839 Body mass index (BMI) 39.0-39.9, adult: Secondary | ICD-10-CM

## 2021-08-25 MED ORDER — VITAMIN D (ERGOCALCIFEROL) 1.25 MG (50000 UNIT) PO CAPS: ORAL_CAPSULE | ORAL | 0 refills | Status: DC

## 2021-08-25 MED ORDER — PHENTERMINE HCL 37.5 MG PO TABS: ORAL_TABLET | ORAL | 0 refills | Status: DC

## 2021-08-25 NOTE — Progress Notes (Signed)
Chief Complaint:   OBESITY Kara King is here to discuss her progress with her obesity treatment plan along with follow-up of her obesity related diagnoses. Kara King is on the Category 1 Plan and states she is following her eating plan approximately 85% of the time. [name] states she is doing FIA for 45 minutes 4 times per week.  Today's visit was #: 20 Starting weight: 240 lbs Starting date: 08/08/2020 Today's weight: 186 lbs Today's date: 08/25/2021 Total lbs lost to date: 1 Total lbs lost since last in-office visit: 3  Interim History: Kara King is in the middle of a job transition and she states that she "eats her emotions". After increasing phentermine to total 37.5mg , she is snacking less. She is taking 1/2 tablet in the morning and the other 1/2 early afternoon. She denies excessive hunger.  Subjective:   1. Polyphagia Kara King is on phentermine, which she notes decreases her appetite. She denies polyphagia currently.   2. Vitamin D deficiency Kara King is on Vit D every 10 days. Her last Vit D level was 71.9.  3. At risk for osteoporosis Kara King is at higher risk of osteopenia and osteoporosis due to Vitamin D deficiency.   Assessment/Plan:   1. Polyphagia Intensive lifestyle modifications are the first line treatment for this issue. We discussed several lifestyle modifications today. We will refill phentermine for 1 month. Krissa will continue her meal plan, exercise, and weight loss efforts. Orders and follow up as documented in patient record.  Counseling Polyphagia is excessive hunger. Causes can include: low blood sugars, hypERthyroidism, PMS, lack of sleep, stress, insulin resistance, diabetes, certain medications, and diets that are deficient in protein and fiber.   - phentermine (ADIPEX-P) 37.5 MG tablet; 1 tab po q am ( pt not given script 2/1, just dose change)  Dispense: 30 tablet; Refill: 0  2. Vitamin D deficiency Low Vitamin D level  contributes to fatigue and are associated with obesity, breast, and colon cancer. We will refill prescription Vitamin D for 1 month. Kara King will follow-up for routine testing of Vitamin D, at least 2-3 times per year to avoid over-replacement.  - Vitamin D, Ergocalciferol, (DRISDOL) 1.25 MG (50000 UNIT) CAPS capsule; 1 po q 10 days  ( pt not given script 2/1, just dose change)  Dispense: 3 capsule; Refill: 0  3. At risk for osteoporosis Kara King was given approximately 15 minutes of osteoporosis prevention counseling today. Kara King is at risk for osteopenia and osteoporosis due to her Vitamin D deficiency. She was encouraged to take her Vit D and follow her higher calcium diet and increase strengthening exercise to help strengthen her bones and decrease her risk of osteopenia and osteoporosis.  4. Obesity with current BMI of 30.95 Kara King is currently in the action stage of change. As such, her goal is to continue with weight loss efforts. She has agreed to the Category 1 Plan.   Exercise goals: As is.  Behavioral modification strategies: increasing lean protein intake and meal planning and cooking strategies.  Kara King has agreed to follow-up with our clinic in 3 weeks. She was informed of the importance of frequent follow-up visits to maximize her success with intensive lifestyle modifications for her multiple health conditions.   Objective:   Blood pressure 98/61, pulse (!) 53, temperature 97.7 F (36.5 C), height 5\' 5"  (1.651 m), weight 186 lb (84.4 kg), last menstrual period 09/30/2016, SpO2 99 %. Body mass index is 30.95 kg/m.  General: Cooperative, alert, well developed, in no acute distress. HEENT:  Conjunctivae and lids unremarkable. Cardiovascular: Regular rhythm.  Lungs: Normal work of breathing. Neurologic: No focal deficits.   Lab Results  Component Value Date   CREATININE 0.67 07/03/2021   BUN 17 07/03/2021   NA 142 07/03/2021   K 4.3 07/03/2021   CL 105  07/03/2021   CO2 23 07/03/2021   Lab Results  Component Value Date   ALT 18 12/18/2020   AST 23 12/18/2020   ALKPHOS 81 12/18/2020   BILITOT 0.4 12/18/2020   Lab Results  Component Value Date   HGBA1C 5.5 07/03/2021   HGBA1C 5.6 08/08/2020   Lab Results  Component Value Date   INSULIN 2.2 (L) 07/03/2021   INSULIN 4.5 08/08/2020   Lab Results  Component Value Date   TSH 1.450 08/08/2020   Lab Results  Component Value Date   CHOL 191 07/03/2021   HDL 70 07/03/2021   LDLCALC 112 (H) 07/03/2021   TRIG 49 07/03/2021   CHOLHDL 3.3 12/18/2020   Lab Results  Component Value Date   VD25OH 71.9 07/03/2021   VD25OH 55.6 12/18/2020   VD25OH 21.9 (L) 08/08/2020   Lab Results  Component Value Date   WBC 4.9 08/08/2020   HGB 14.0 08/08/2020   HCT 43.8 08/08/2020   MCV 91 08/08/2020   PLT 268 08/08/2020   No results found for: IRON, TIBC, FERRITIN  Attestation Statements:   Reviewed by clinician on day of visit: allergies, medications, problem list, medical history, surgical history, family history, social history, and previous encounter notes.   Wilhemena Durie, am acting as transcriptionist for [employer], PA-C.  I have reviewed the above documentation for accuracy and completeness, and I agree with the above. Abby Potash, PA-C

## 2021-08-28 ENCOUNTER — Other Ambulatory Visit: Payer: Self-pay

## 2021-08-28 ENCOUNTER — Ambulatory Visit (AMBULATORY_SURGERY_CENTER): Payer: 59 | Admitting: *Deleted

## 2021-08-28 VITALS — Ht 65.0 in | Wt 184.0 lb

## 2021-08-28 DIAGNOSIS — Z1211 Encounter for screening for malignant neoplasm of colon: Secondary | ICD-10-CM

## 2021-08-28 MED ORDER — PEG 3350-KCL-NA BICARB-NACL 420 G PO SOLR: 4000.0000 mL | Freq: Once | ORAL | 0 refills | Status: AC
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
## 2021-09-09 ENCOUNTER — Encounter: Payer: Self-pay | Admitting: Gastroenterology

## 2021-09-12 ENCOUNTER — Other Ambulatory Visit: Payer: Self-pay

## 2021-09-12 ENCOUNTER — Encounter: Payer: Self-pay | Admitting: Gastroenterology

## 2021-09-12 ENCOUNTER — Ambulatory Visit (AMBULATORY_SURGERY_CENTER): Payer: 59 | Admitting: Gastroenterology

## 2021-09-12 VITALS — BP 120/67 | HR 52 | Temp 98.2°F | Resp 11 | Ht 65.0 in | Wt 184.0 lb

## 2021-09-12 VITALS — BP 136/78 | HR 47 | Temp 97.3°F | Resp 12 | Ht 65.0 in | Wt 184.0 lb

## 2021-09-12 DIAGNOSIS — D128 Benign neoplasm of rectum: Secondary | ICD-10-CM | POA: Diagnosis not present

## 2021-09-12 DIAGNOSIS — Z1211 Encounter for screening for malignant neoplasm of colon: Secondary | ICD-10-CM

## 2021-09-12 DIAGNOSIS — K640 First degree hemorrhoids: Secondary | ICD-10-CM | POA: Diagnosis not present

## 2021-09-12 DIAGNOSIS — K621 Rectal polyp: Secondary | ICD-10-CM | POA: Diagnosis not present

## 2021-09-12 DIAGNOSIS — K635 Polyp of colon: Secondary | ICD-10-CM | POA: Diagnosis not present

## 2021-09-12 DIAGNOSIS — D120 Benign neoplasm of cecum: Secondary | ICD-10-CM

## 2021-09-12 DIAGNOSIS — Z538 Procedure and treatment not carried out for other reasons: Secondary | ICD-10-CM

## 2021-09-12 MED ORDER — SODIUM CHLORIDE 0.9 % IV SOLN: 500.0000 mL | Freq: Once | INTRAVENOUS | Status: DC

## 2021-09-12 NOTE — Progress Notes (Signed)
PT taken to PACU. Monitors in place. VSS. Report given to RN. 

## 2021-09-12 NOTE — Progress Notes (Signed)
? ?GASTROENTEROLOGY PROCEDURE H&P NOTE  ? ?Primary Care Physician: ?Kara Schanz R, DO ? ? ? ?Reason for Procedure:  Colon Cancer screening ? ?Plan:    Colonoscopy ? ?Patient is appropriate for endoscopic procedure(s) in the ambulatory ([place]) setting. ? ?The nature of the procedure, as well as the risks, benefits, and alternatives were carefully and thoroughly reviewed with the patient. Ample time for discussion and questions allowed. The patient understood, was satisfied, and agreed to proceed.  ? ? ? ?HPI: ?Kara King is a 56 y.o. female who presents for colonoscopy for routine Colon Cancer screening.  No active GI symptoms.  No known family history of colon cancer or related malignancy.  Patient is otherwise without complaints or active issues today. ? ?Past Medical History:  ?Diagnosis Date  ? Allergy   ? Asthma   ? Constipation   ? Depression   ? Migraine headache   ? SOBOE (shortness of breath on exertion)   ? Swelling of both lower extremities   ? Varicose veins   ? Vitamin D deficiency   ? ? ?Past Surgical History:  ?Procedure Laterality Date  ? G 3 P 3    ? TUBAL LIGATION    ? ? ?Prior to Admission medications   ?Medication Sig Start Date End Date Taking? Authorizing Provider  ?Multiple Vitamin (MULTIVITAMIN) tablet Take 1 tablet by mouth daily.   Yes [provider]  ?rizatriptan (MAXALT) 10 MG tablet TAKE 1 TABLET BY MOUTH ONCE DAILY AS NEEDED FOR MIGRAINE HEADACHE 04/15/21  Yes Ann Held, DO  ?topiramate (TOPAMAX) 50 MG tablet Take 1 tablet by mouth twice daily 05/14/21  Yes Ann Held, DO  ?Vitamin D, Ergocalciferol, (DRISDOL) 1.25 MG (50000 UNIT) CAPS capsule 1 po q 10 days  ( pt not given script 2/1, just dose change) 08/25/21  Yes Abby Potash, PA-C  ?albuterol (VENTOLIN HFA) 108 (90 Base) MCG/ACT inhaler Inhale 2 puffs into the lungs every 6 (six) hours as needed for shortness of breath (cough). 07/30/20   Kara Schanz R, DO   ?GLUCOSAMINE-CHONDROITIN PO Take 1 capsule by mouth 2 (two) times daily. ?Patient not taking: Reported on 08/28/2021    [provider]  ?Magnesium Sulfate, Laxative, POWD Use CALM supp q hs prn.  ( 325 of Magnesium per serving ) ?Patient not taking: Reported on 09/12/2021 04/10/21   Mellody Dance, DO  ?NONFORMULARY OR COMPOUNDED ITEM Compression stocking 20-104mm/hg #1  As directed ?Patient not taking: Reported on 08/28/2021 07/30/20   Ann Held, DO  ?phentermine (ADIPEX-P) 37.5 MG tablet 1 tab po q am ( pt not given script 2/1, just dose change) 08/25/21   Dennard Nip D, MD  ?Polyethylene Glycol 3350 (MIRALAX PO) Take by mouth. daily    [provider]  ? ? ?Current Outpatient Medications  ?Medication Sig Dispense Refill  ? Multiple Vitamin (MULTIVITAMIN) tablet Take 1 tablet by mouth daily.    ? rizatriptan (MAXALT) 10 MG tablet TAKE 1 TABLET BY MOUTH ONCE DAILY AS NEEDED FOR MIGRAINE HEADACHE 12 tablet 3  ? topiramate (TOPAMAX) 50 MG tablet Take 1 tablet by mouth twice daily 180 tablet 0  ? Vitamin D, Ergocalciferol, (DRISDOL) 1.25 MG (50000 UNIT) CAPS capsule 1 po q 10 days  ( pt not given script 2/1, just dose change) 3 capsule 0  ? albuterol (VENTOLIN HFA) 108 (90 Base) MCG/ACT inhaler Inhale 2 puffs into the lungs every 6 (six) hours as needed for shortness  of breath (cough). 1 each 0  ? GLUCOSAMINE-CHONDROITIN PO Take 1 capsule by mouth 2 (two) times daily. (Patient not taking: Reported on 08/28/2021)    ? Magnesium Sulfate, Laxative, POWD Use CALM supp q hs prn.  ( 325 of Magnesium per serving ) (Patient not taking: Reported on 09/12/2021)  0  ? NONFORMULARY OR COMPOUNDED ITEM Compression stocking 20-22mm/hg #1  As directed (Patient not taking: Reported on 08/28/2021) 1 each 0  ? phentermine (ADIPEX-P) 37.5 MG tablet 1 tab po q am ( pt not given script 2/1, just dose change) 30 tablet 0  ? Polyethylene Glycol 3350 (MIRALAX PO) Take by mouth. daily    ? ?Current  Facility-Administered Medications  ?Medication Dose Route Frequency Provider Last Rate Last Admin  ? 0.9 %  sodium chloride infusion  500 mL Intravenous Once [name], DO      ? ? ?Allergies as of 09/12/2021 - Review Complete 09/12/2021  ?Allergen Reaction Noted  ? Wellbutrin [bupropion]  01/30/2013  ? Effexor [venlafaxine]  12/18/2016  ? ? ?Family History  ?Adopted: Yes  ?Problem Relation Age of Onset  ? Alcohol abuse Father   ? Heart attack Brother 68  ?      smoker  ? Heart disease Brother   ? Hypertension Brother   ? Uterine cancer Maternal Grandmother   ? Cancer Maternal Grandmother   ? Stroke Paternal Grandmother   ?     > 65  ? Diabetes Paternal Grandmother   ? Colon cancer Neg Hx   ? Esophageal cancer Neg Hx   ? Rectal cancer Neg Hx   ? Stomach cancer Neg Hx   ? ? ?Social History  ? ?Socioeconomic History  ? Marital status: Single  ?  Spouse name: Not on file  ? Number of children: 3  ? Years of education: Not on file  ? Highest education level: Not on file  ?Occupational History  ? Occupation: Glass blower/designer & sales  ?  Comment: [employer]  ?Tobacco Use  ? Smoking status: Former  ?  Packs/day: 2.00  ?  Types: Cigarettes  ?  Quit date: 01/03/2011  ?  Years since quitting: 10.6  ? Smokeless tobacco: Never  ? Tobacco comments:  ?  smoked age [age] (not with pregnancies , up to 2 ppd  ?Vaping Use  ? Vaping Use: Never used  ?Substance and Sexual Activity  ? Alcohol use: Yes  ?  Comment: 1-2 times a month  ? Drug use: No  ? Sexual activity: Never  ?  Partners: Female  ?Other Topics Concern  ? Not on file  ?Social History Narrative  ? Not on file  ? ?Social Determinants of Health  ? ?Financial Resource Strain: Not on file  ?Food Insecurity: Not on file  ?Transportation Needs: Not on file  ?Physical Activity: Not on file  ?Stress: Not on file  ?Social Connections: Not on file  ?Intimate Partner Violence: Not on file  ? ? ?Physical Exam: ?Vital signs in last 24 hours: ?@BP  (!) 141/69   Pulse (!) 55    Temp 98.2 ?F (36.8 ?C)   Ht 5\' 5"  (1.651 m)   Wt 184 lb (83.5 kg)   LMP 09/30/2016   SpO2 100%   BMI 30.62 kg/m?  ?GEN: NAD ?EYE: Sclerae anicteric ?ENT: MMM ?CV: Non-tachycardic ?Pulm: CTA b/l ?GI: Soft, NT/ND ?NEURO:  Alert & Oriented x 3 ? ? ?Gerrit Heck, DO ?[facility] ? ? ?09/12/2021 3:51 PM ? ?

## 2021-09-12 NOTE — Progress Notes (Signed)
Pt's states no medical or surgical changes since previsit or office visit. 

## 2021-09-12 NOTE — Patient Instructions (Signed)
Handouts given on Hemorrhoids and polyps.  Resume previous diet and continue current medications.  Await pathology results. ? ?YOU HAD AN ENDOSCOPIC PROCEDURE TODAY AT [facility]:   Refer to the procedure report that was given to you for any specific questions about what was found during the examination.  If the procedure report does not answer your questions, please call your gastroenterologist to clarify.  If you requested that your care partner not be given the details of your procedure findings, then the procedure report has been included in a sealed envelope for you to review at your convenience later. ? ?YOU SHOULD EXPECT: Some feelings of bloating in the abdomen. Passage of more gas than usual.  Walking can help get rid of the air that was put into your GI tract during the procedure and reduce the bloating. If you had a lower endoscopy (such as a colonoscopy or flexible sigmoidoscopy) you may notice spotting of blood in your stool or on the toilet paper. If you underwent a bowel prep for your procedure, you may not have a normal bowel movement for a few days. ? ?Please Note:  You might notice some irritation and congestion in your nose or some drainage.  This is from the oxygen used during your procedure.  There is no need for concern and it should clear up in a day or so. ? ?SYMPTOMS TO REPORT IMMEDIATELY: ? ?Following lower endoscopy (colonoscopy or flexible sigmoidoscopy): ? Excessive amounts of blood in the stool ? Significant tenderness or worsening of abdominal pains ? Swelling of the abdomen that is new, acute ? Fever of 100?F or higher ? ? ?For urgent or emergent issues, a gastroenterologist can be reached at any hour by calling [phone]. ?Do not use MyChart messaging for urgent concerns.  ? ? ?DIET:  We do recommend a small meal at first, but then you may proceed to your regular diet.  Drink plenty of fluids but you should avoid alcoholic beverages for 24 hours. ? ?ACTIVITY:   You should plan to take it easy for the rest of today and you should NOT DRIVE or use heavy machinery until tomorrow (because of the sedation medicines used during the test).   ? ?FOLLOW UP: ?Our staff will call the number listed on your records 48-72 hours following your procedure to check on you and address any questions or concerns that you may have regarding the information given to you following your procedure. If we do not reach you, we will leave a message.  We will attempt to reach you two times.  During this call, we will ask if you have developed any symptoms of COVID 19. If you develop any symptoms (ie: fever, flu-like symptoms, shortness of breath, cough etc.) before then, please call [phone].  If you test positive for Covid 19 in the 2 weeks post procedure, please call and report this information to [place].   ? ?If any biopsies were taken you will be contacted by phone or by letter within the next 1-3 weeks.  Please call us at [phone] if you have not heard about the biopsies in 3 weeks.  ? ? ?SIGNATURES/CONFIDENTIALITY: ?You and/or your care partner have signed paperwork which will be entered into your electronic medical record.  These signatures attest to the fact that that the information above on your After Visit Summary has been reviewed and is understood.  Full responsibility of the confidentiality of this discharge information lies with you and/or your care-partner.  ?

## 2021-09-12 NOTE — Op Note (Signed)
[place] ?Patient Name: Kara King ?Procedure Date: 09/12/2021 3:49 PM ?MRN: [id number] ?Endoscopist: Gerrit Heck , MD ?Age: 56 ?Referring MD:  ?Date of Birth: July 16, 1965 ?Gender: Female ?Account #: [id number] ?Procedure:                Colonoscopy ?Indications:              Screening for colorectal malignant neoplasm ?                          Colonoscopy attempted earlier today by Dr. Tarri Glenn  ?                          and notable for retained stool, and procedure  ?                          aborted. She subseqently drank additional bowel  ?                          preparation and presents this afternoon for repeat  ?                          colonoscopy attempt. ?Medicines:                Monitored Anesthesia Care ?Procedure:                Pre-Anesthesia Assessment: ?                          - Prior to the procedure, a History and Physical  ?                          was performed, and patient medications and  ?                          allergies were reviewed. The patient's tolerance of  ?                          previous anesthesia was also reviewed. The risks  ?                          and benefits of the procedure and the sedation  ?                          options and risks were discussed with the patient.  ?                          All questions were answered, and informed consent  ?                          was obtained. Prior Anticoagulants: The patient has  ?                          taken no previous anticoagulant or antiplatelet  ?  agents. ASA Grade Assessment: II - A patient with  ?                          mild systemic disease. After reviewing the risks  ?                          and benefits, the patient was deemed in  ?                          satisfactory condition to undergo the procedure. ?                          After obtaining informed consent, the colonoscope  ?                          was passed under direct vision. Throughout the  ?                           procedure, the patient's blood pressure, pulse, and  ?                          oxygen saturations were monitored continuously. The  ?                          CF [id number] #[id number] was introduced through the anus  ?                          and advanced to the the cecum, identified by  ?                          appendiceal orifice and ileocecal valve. The  ?                          colonoscopy was performed without difficulty. The  ?                          patient tolerated the procedure well. The quality  ?                          of the bowel preparation was good. The ileocecal  ?                          valve, appendiceal orifice, and rectum were  ?                          photographed. ?Scope In: 4:00:44 PM ?Scope Out: 4:28:10 PM ?Scope Withdrawal Time: 0 hours 20 minutes 38 seconds  ?Total Procedure Duration: 0 hours 27 minutes 26 seconds  ?Findings:                 The perianal and digital rectal examinations were  ?                          normal. ?  Two semi-sessile polyps were found in the cecum.  ?                          The polyps were 4 to 8 mm in size. These polyps  ?                          were removed with a cold snare. Resection and  ?                          retrieval were complete. Estimated blood loss was  ?                          minimal. ?                          A 2 mm polyp was found in the rectum. The polyp was  ?                          sessile. The polyp was removed with a cold snare.  ?                          Resection and retrieval were complete. Estimated  ?                          blood loss was minimal. ?                          Non-bleeding internal hemorrhoids were found during  ?                          retroflexion. The hemorrhoids were small. ?Complications:            No immediate complications. ?Estimated Blood Loss:     Estimated blood loss was minimal. ?Impression:               - Two 4 to 8 mm polyps in the  cecum, removed with a  ?                          cold snare. Resected and retrieved. ?                          - One 2 mm polyp in the rectum, removed with a cold  ?                          snare. Resected and retrieved. ?                          - Non-bleeding internal hemorrhoids. ?Recommendation:           - Patient has a contact number available for  ?                          emergencies. The signs and symptoms of potential  ?  delayed complications were discussed with the  ?                          patient. Return to normal activities tomorrow.  ?                          Written discharge instructions were provided to the  ?                          patient. ?                          - Resume previous diet. ?                          - Continue present medications. ?                          - Await pathology results. ?                          - Repeat colonoscopy for surveillance based on  ?                          pathology results. ?                          - Return to GI clinic PRN. ?Gerrit Heck, MD ?09/12/2021 4:38:48 PM ?

## 2021-09-12 NOTE — Progress Notes (Signed)
? ?Referring Provider: Carollee Herter, Alferd Apa, * ?Primary Care Physician:  Carollee Herter, Alferd Apa, DO ? ?Reason for Procedure:  Colon cancer screening ? ? ?IMPRESSION:  ?Need for colon cancer screening ?Appropriate candidate for monitored anesthesia care ? ?PLAN: ?Colonoscopy in the [facility] today ? ? ?HPI: Kara King is a 56 y.o. female presents for screening colonoscopy. ? ?No prior colonoscopy or colon cancer screening. ? ?Chronic constipation.  ? ?No known family history of colon cancer or polyps. No family history of uterine/endometrial cancer, pancreatic cancer or gastric/stomach cancer. ? ? ?Past Medical History:  ?Diagnosis Date  ? Allergy   ? Asthma   ? Constipation   ? Depression   ? Migraine headache   ? SOBOE (shortness of breath on exertion)   ? Swelling of both lower extremities   ? Varicose veins   ? Vitamin D deficiency   ? ? ?Past Surgical History:  ?Procedure Laterality Date  ? G 3 P 3    ? TUBAL LIGATION    ? ? ?Current Outpatient Medications  ?Medication Sig Dispense Refill  ? Multiple Vitamin (MULTIVITAMIN) tablet Take 1 tablet by mouth daily.    ? topiramate (TOPAMAX) 50 MG tablet Take 1 tablet by mouth twice daily 180 tablet 0  ? albuterol (VENTOLIN HFA) 108 (90 Base) MCG/ACT inhaler Inhale 2 puffs into the lungs every 6 (six) hours as needed for shortness of breath (cough). 1 each 0  ? GLUCOSAMINE-CHONDROITIN PO Take 1 capsule by mouth 2 (two) times daily. (Patient not taking: Reported on 08/28/2021)    ? Magnesium Sulfate, Laxative, POWD Use CALM supp q hs prn.  ( 325 of Magnesium per serving ) (Patient not taking: Reported on 09/12/2021)  0  ? NONFORMULARY OR COMPOUNDED ITEM Compression stocking 20-21mm/hg #1  As directed (Patient not taking: Reported on 08/28/2021) 1 each 0  ? phentermine (ADIPEX-P) 37.5 MG tablet 1 tab po q am ( pt not given script 2/1, just dose change) 30 tablet 0  ? Polyethylene Glycol 3350 (MIRALAX PO) Take by mouth. daily    ? rizatriptan (MAXALT) 10 MG tablet TAKE 1  TABLET BY MOUTH ONCE DAILY AS NEEDED FOR MIGRAINE HEADACHE 12 tablet 3  ? Vitamin D, Ergocalciferol, (DRISDOL) 1.25 MG (50000 UNIT) CAPS capsule 1 po q 10 days  ( pt not given script 2/1, just dose change) 3 capsule 0  ? ?Current Facility-Administered Medications  ?Medication Dose Route Frequency Provider Last Rate Last Admin  ? 0.9 %  sodium chloride infusion  500 mL Intravenous Once Thornton Park, MD      ? ? ?Allergies as of 09/12/2021 - Review Complete 09/12/2021  ?Allergen Reaction Noted  ? Wellbutrin [bupropion]  01/30/2013  ? Effexor [venlafaxine]  12/18/2016  ? ? ?Family History  ?Adopted: Yes  ?Problem Relation Age of Onset  ? Alcohol abuse Father   ? Heart attack Brother 59  ?      smoker  ? Heart disease Brother   ? Hypertension Brother   ? Uterine cancer Maternal Grandmother   ? Cancer Maternal Grandmother   ? Stroke Paternal Grandmother   ?     > 65  ? Diabetes Paternal Grandmother   ? Colon cancer Neg Hx   ? Esophageal cancer Neg Hx   ? Rectal cancer Neg Hx   ? Stomach cancer Neg Hx   ? ? ? ?Physical Exam: ?General:   Alert,  well-nourished, pleasant and cooperative in NAD ?Head:  Normocephalic and atraumatic. ?Eyes:  Sclera clear, no icterus.   Conjunctiva pink. ?Mouth:  No deformity or lesions.   ?Neck:  Supple; no masses or thyromegaly. ?Lungs:  Clear throughout to auscultation.   No wheezes. ?Heart:  Regular rate and rhythm; no murmurs. ?Abdomen:  Soft, non-tender, nondistended, normal bowel sounds, no rebound or guarding.  ?Msk:  Symmetrical. No boney deformities ?LAD: No inguinal or umbilical LAD ?Extremities:  No clubbing or edema. ?Neurologic:  Alert and  oriented x4;  grossly nonfocal ?Skin:  No obvious rash or bruise. ?Psych:  Alert and cooperative. Normal mood and affect. ? ? ? ? ?Studies/Results: ?No results found. ? ? ? ?Kara Riga L. Tarri Glenn, MD, MPH ?09/12/2021, 11:14 AM ? ? ? ?  ?

## 2021-09-12 NOTE — Op Note (Signed)
[place] ?Patient Name: Chee Dimon ?Procedure Date: 09/12/2021 11:15 AM ?MRN: [id number] ?Endoscopist: Thornton Park MD, MD ?Age: 56 ?Referring MD:  ?Date of Birth: [birth year] ?Gender: Female ?Account #: [id number] ?Procedure:                Colonoscopy ?Indications:              Screening for colorectal malignant neoplasm, This  ?                          is the patient's first colonoscopy ?                          No known family history of colon cancer or polyps ?Medicines:                Monitored Anesthesia Care ?Procedure:                Pre-Anesthesia Assessment: ?                          - Prior to the procedure, a History and Physical  ?                          was performed, and patient medications and  ?                          allergies were reviewed. The patient's tolerance of  ?                          previous anesthesia was also reviewed. The risks  ?                          and benefits of the procedure and the sedation  ?                          options and risks were discussed with the patient.  ?                          All questions were answered, and informed consent  ?                          was obtained. Prior Anticoagulants: The patient has  ?                          taken no previous anticoagulant or antiplatelet  ?                          agents. ASA Grade Assessment: II - A patient with  ?                          mild systemic disease. After reviewing the risks  ?                          and benefits, the patient was deemed in  ?  satisfactory condition to undergo the procedure. ?                          After obtaining informed consent, the colonoscope  ?                          was passed under direct vision. Throughout the  ?                          procedure, the patient's blood pressure, pulse, and  ?                          oxygen saturations were monitored continuously. The  ?                          CF [id number] #[id number]  was introduced through the anus  ?                          with the intention of advancing to the ileum. The  ?                          scope was advanced to the transverse colon before  ?                          the procedure was aborted. Medications were given.  ?                          The colonoscopy was performed with moderate  ?                          difficulty due to inadequate bowel prep. The  ?                          patient tolerated the procedure well. The quality  ?                          of the bowel preparation was inadequate. ?Scope In: 11:22:30 AM ?Scope Out: 11:26:41 AM ?Total Procedure Duration: 0 hours 4 minutes 11 seconds  ?Findings:                 A moderate amount of liquid semi-liquid stool was  ?                          found in the entire colon, precluding visualization. ?                          The exam was otherwise without abnormality on  ?                          direct and retroflexion views. ?Complications:            No immediate complications. ?Estimated Blood Loss:     Estimated blood loss: none. ?Impression:               - Preparation of the  colon was inadequate. ?                          - Stool in the entire examined colon. ?                          - The examination was otherwise normal on direct  ?                          and retroflexion views. ?                          - No specimens collected. ?Recommendation:           - Patient has a contact number available for  ?                          emergencies. The signs and symptoms of potential  ?                          delayed complications were discussed with the  ?                          patient. Return to normal activities tomorrow.  ?                          Written discharge instructions were provided to the  ?                          patient. ?                          - Resume previous diet. ?                          - Continue present medications. ?                          - Repeat  colonoscopy at the next available  ?                          appointment because the bowel preparation was poor.  ?                          Two day bowel prep at that time. ?Thornton Park MD, MD ?09/12/2021 11:30:43 AM ?This report has been signed electronically. ?

## 2021-09-12 NOTE — Progress Notes (Signed)
Patient was not cleaned out for colonoscopy. Dr. Tarri Glenn rescheduled her with Dr. Bryan Lemma for 4:00 pm today. Prep and instructions given by Judson Roch Monday RN, pt verbalized understanding. No discharge summary given because patient coming back at 3:00 pm.   ?

## 2021-09-12 NOTE — Progress Notes (Signed)
Called to room to assist during endoscopic procedure.  Patient ID and intended procedure confirmed with present staff. Received instructions for my participation in the procedure from the performing physician.  

## 2021-09-15 ENCOUNTER — Other Ambulatory Visit: Payer: Self-pay

## 2021-09-15 ENCOUNTER — Ambulatory Visit (INDEPENDENT_AMBULATORY_CARE_PROVIDER_SITE_OTHER): Payer: 59 | Admitting: Family Medicine

## 2021-09-15 ENCOUNTER — Encounter (INDEPENDENT_AMBULATORY_CARE_PROVIDER_SITE_OTHER): Payer: Self-pay | Admitting: Family Medicine

## 2021-09-15 VITALS — BP 112/71 | HR 54 | Temp 97.7°F | Ht 65.0 in | Wt 183.0 lb

## 2021-09-15 DIAGNOSIS — Z9189 Other specified personal risk factors, not elsewhere classified: Secondary | ICD-10-CM

## 2021-09-15 DIAGNOSIS — Z6830 Body mass index (BMI) 30.0-30.9, adult: Secondary | ICD-10-CM | POA: Diagnosis not present

## 2021-09-15 DIAGNOSIS — R632 Polyphagia: Secondary | ICD-10-CM | POA: Diagnosis not present

## 2021-09-15 DIAGNOSIS — E559 Vitamin D deficiency, unspecified: Secondary | ICD-10-CM

## 2021-09-15 DIAGNOSIS — Z6839 Body mass index (BMI) 39.0-39.9, adult: Secondary | ICD-10-CM

## 2021-09-15 DIAGNOSIS — E669 Obesity, unspecified: Secondary | ICD-10-CM

## 2021-09-15 MED ORDER — VITAMIN D (ERGOCALCIFEROL) 1.25 MG (50000 UNIT) PO CAPS: ORAL_CAPSULE | ORAL | 0 refills | Status: DC

## 2021-09-16 ENCOUNTER — Telehealth: Payer: Self-pay

## 2021-09-16 NOTE — Telephone Encounter (Signed)
?  Follow up Call- ? ?Call back number 09/12/2021 09/12/2021  ?Post procedure Call Back phone  # [phone]  ?Permission to leave phone message Yes Yes  ?Some recent data might be hidden  ?  ? ?Patient questions: ? ?Do you have a fever, pain , or abdominal swelling? No. ?Pain Score  0 * ? ?Have you tolerated food without any problems? Yes.   ? ?Have you been able to return to your normal activities? Yes.   ? ?Do you have any questions about your discharge instructions: ?Diet   No. ?Medications  No. ?Follow up visit  No. ? ?Do you have questions or concerns about your Care? No. ? ?Actions: ?* If pain score is 4 or above: ?No action needed, pain <4. ? ? ?

## 2021-09-17 NOTE — Progress Notes (Signed)
? ? ? ?Chief Complaint:  ? ?OBESITY ?Kara King is here to discuss her progress with her obesity treatment plan along with follow-up of her obesity related diagnoses. Kara King is on the Category 1 Plan and states she is following her eating plan approximately 85% of the time. Kara King states she is doing [employer] for 45 minutes 4 times per week. ? ?Today's visit was #: 21 ?Starting weight: 240 lbs ?Starting date: 08/08/2020 ?Today's weight: 183 lbs ?Today's date: 09/15/2021 ?Total lbs lost to date: 60 ?Total lbs lost since last in-office visit: 3 ? ?Interim History: Kara King had a colonoscopy this past Friday. She hasn't taken phentermine since the 20th of February due to recent procedure. She has no issues with the plan or food. She denies hunger or cravings. ? ?Subjective:  ? ?1. Polyphagia ?Kara King had a script written on 08/25/2021 for phentermine, and she has not picked it up yet. ? ?2. Vitamin D deficiency ?Kara King is currently taking prescription vitamin D 50,000 IU every 10 days. She denies nausea, vomiting or muscle weakness. ? ?3. At risk for dehydration ?Kara King is at risk for dehydration due to exercising outside in high temps. ? ?Assessment/Plan:  ?No orders of the defined types were placed in this encounter. ? ? ?Medications Discontinued During This Encounter  ?Medication Reason  ? Vitamin D, Ergocalciferol, (DRISDOL) 1.25 MG (50000 UNIT) CAPS capsule Reorder  ?  ? ?Meds ordered this encounter  ?Medications  ? Vitamin D, Ergocalciferol, (DRISDOL) 1.25 MG (50000 UNIT) CAPS capsule  ?  Sig: 1 po q 10 days  ( pt not given script 2/1, just dose change)  ?  Dispense:  3 capsule  ?  Refill:  0  ?  Ov for rf  ?  ? ?1. Polyphagia ?Kara King agreed to restart phentermine 112 mg tablets daily. She may increase to 1 tablet daily if she feels she needs it. She checked with her insurance and they will not cover a GLP-1 at all for weight loss medications ([place], [place]). Orders and follow up as documented in  patient record. ? ?Counseling ?Polyphagia is excessive hunger. ?Causes can include: low blood sugars, hypERthyroidism, PMS, lack of sleep, stress, insulin resistance, diabetes, certain medications, and diets that are deficient in protein and fiber.  ? ?2. Vitamin D deficiency ?Kara King will continue prescription Vitamin D 50,000 IU every 10 days, and we will refill for 1 month. She will follow-up for routine testing of Vitamin D, at least 2-3 times per year to avoid over-replacement. ? ?- Vitamin D, Ergocalciferol, (DRISDOL) 1.25 MG (50000 UNIT) CAPS capsule; 1 po q 10 days  ( pt not given script 2/1, just dose change)  Dispense: 3 capsule; Refill: 0 ? ?3. At risk for dehydration ?Kara King is at higher than average risk of dehydration. Kara King was given more than 9 minutes of proper hydration counseling today. We discussed the signs and symptoms of dehydration, some of which may include muscle cramping, constipation or even orthostatic symptoms.  Counseling on the prevention of dehydration was also provided today. Kara King is at risk for dehydration due to weight loss, lifestyle and behavorial habits and possibly due to taking certain medication(s). She was encouraged to adequately hydrate and monitor fluid status to avoid dehydration as well as weight loss plateaus. Unless pre-existing renal or cardiopulmonary conditions exist, in which patient was told to limit their fluid intake, I recommended roughly one half of their weight in pounds to be the approximate ounces of non-caloric, non-caffeinated beverages they should drink per day; including  more if they are engaging in exercise. ? ?4. Obesity, current BMI 30.5 ?Kara King is currently in the action stage of change. As such, her goal is to continue with weight loss efforts. She has agreed to the Category 1 Plan.  ? ?Exercise goals: As is, add additional strength training to her regimen. ? ?Behavioral modification strategies: increasing lean protein intake,  increasing water intake with increasing exercise, and planning for success. ? ?Kara King has agreed to follow-up with our clinic in 3 weeks. She was informed of the importance of frequent follow-up visits to maximize her success with intensive lifestyle modifications for her multiple health conditions.  ? ?Objective:  ? ?Blood pressure 112/71, pulse (!) 54, temperature 97.7 ?F (36.5 ?C), height '5\' 5"'$  (1.651 m), weight 183 lb (83 kg), last menstrual period 09/30/2016, SpO2 99 %. ?Body mass index is 30.45 kg/m?. ? ?General: Cooperative, alert, well developed, in no acute distress. ?HEENT: Conjunctivae and lids unremarkable. ?Cardiovascular: Regular rhythm.  ?Lungs: Normal work of breathing. ?Neurologic: No focal deficits.  ? ?Lab Results  ?Component Value Date  ? CREATININE 0.67 07/03/2021  ? BUN 17 07/03/2021  ? NA 142 07/03/2021  ? K 4.3 07/03/2021  ? CL 105 07/03/2021  ? CO2 23 07/03/2021  ? ?Lab Results  ?Component Value Date  ? ALT 18 12/18/2020  ? AST 23 12/18/2020  ? ALKPHOS 81 12/18/2020  ? BILITOT 0.4 12/18/2020  ? ?Lab Results  ?Component Value Date  ? HGBA1C 5.5 07/03/2021  ? HGBA1C 5.6 08/08/2020  ? ?Lab Results  ?Component Value Date  ? INSULIN 2.2 (L) 07/03/2021  ? INSULIN 4.5 08/08/2020  ? ?Lab Results  ?Component Value Date  ? TSH 1.450 08/08/2020  ? ?Lab Results  ?Component Value Date  ? CHOL 191 07/03/2021  ? HDL 70 07/03/2021  ? LDLCALC 112 (H) 07/03/2021  ? TRIG 49 07/03/2021  ? CHOLHDL 3.3 12/18/2020  ? ?Lab Results  ?Component Value Date  ? VD25OH 71.9 07/03/2021  ? VD25OH 55.6 12/18/2020  ? VD25OH 21.9 (L) 08/08/2020  ? ?Lab Results  ?Component Value Date  ? WBC 4.9 08/08/2020  ? HGB 14.0 08/08/2020  ? HCT 43.8 08/08/2020  ? MCV 91 08/08/2020  ? PLT 268 08/08/2020  ? ?No results found for: IRON, TIBC, FERRITIN ? ?Attestation Statements:  ? ?Reviewed by clinician on day of visit: allergies, medications, problem list, medical history, surgical history, family history, social history, and previous  encounter notes. ? ? ?I, Trixie Dredge, am acting as transcriptionist for [employer], DO. ? ?I have reviewed the above documentation for accuracy and completeness, and I agree with the above. Marjory Sneddon, D.O. ? ?The [place] was signed into law in 2016 which includes the topic of electronic health records.  This provides immediate access to information in MyChart.  This includes consultation notes, operative notes, office notes, lab results and pathology reports.  If you have any questions about what you read please let us know at your next visit so we can discuss your concerns and take corrective action if need be.  We are right here with you. ? ? ?

## 2021-09-19 ENCOUNTER — Encounter (INDEPENDENT_AMBULATORY_CARE_PROVIDER_SITE_OTHER): Payer: Self-pay | Admitting: Family Medicine

## 2021-09-22 ENCOUNTER — Encounter: Payer: Self-pay | Admitting: Gastroenterology

## 2021-09-22 NOTE — Telephone Encounter (Signed)
Dr.Opalski ?

## 2021-10-06 ENCOUNTER — Encounter (INDEPENDENT_AMBULATORY_CARE_PROVIDER_SITE_OTHER): Payer: Self-pay | Admitting: Family Medicine

## 2021-10-06 ENCOUNTER — Ambulatory Visit (INDEPENDENT_AMBULATORY_CARE_PROVIDER_SITE_OTHER): Payer: 59 | Admitting: Family Medicine

## 2021-10-06 ENCOUNTER — Other Ambulatory Visit: Payer: Self-pay

## 2021-10-06 VITALS — BP 121/72 | HR 48 | Temp 97.7°F | Ht 65.0 in | Wt 187.0 lb

## 2021-10-06 DIAGNOSIS — E559 Vitamin D deficiency, unspecified: Secondary | ICD-10-CM | POA: Diagnosis not present

## 2021-10-06 DIAGNOSIS — R632 Polyphagia: Secondary | ICD-10-CM

## 2021-10-06 DIAGNOSIS — Z6831 Body mass index (BMI) 31.0-31.9, adult: Secondary | ICD-10-CM

## 2021-10-06 DIAGNOSIS — E669 Obesity, unspecified: Secondary | ICD-10-CM | POA: Diagnosis not present

## 2021-10-06 DIAGNOSIS — R69 Illness, unspecified: Secondary | ICD-10-CM | POA: Diagnosis not present

## 2021-10-06 DIAGNOSIS — F5089 Other specified eating disorder: Secondary | ICD-10-CM | POA: Diagnosis not present

## 2021-10-06 DIAGNOSIS — Z9189 Other specified personal risk factors, not elsewhere classified: Secondary | ICD-10-CM

## 2021-10-06 MED ORDER — VITAMIN D (ERGOCALCIFEROL) 1.25 MG (50000 UNIT) PO CAPS: ORAL_CAPSULE | ORAL | 0 refills | Status: DC

## 2021-10-06 MED ORDER — PHENTERMINE HCL 37.5 MG PO TABS: ORAL_TABLET | ORAL | 0 refills | Status: DC

## 2021-10-14 NOTE — Progress Notes (Signed)
? ? ? ?Chief Complaint:  ? ?OBESITY ?Kara King is here to discuss her progress with her obesity treatment plan along with follow-up of her obesity related diagnoses. Kara King is on the Category 1 Plan and states she is following her eating plan approximately 50% of the time. Kara King states she is doing [employer] for 45 minutes 4 times per week. ? ?Today's visit was #: 2 ?Starting weight: 240 lbs ?Starting date: 08/08/2020 ?Today's weight: 187 lbs ?Today's date: 10/06/2021 ?Total lbs lost to date: 53 lbs ?Total lbs lost since last in-office visit: 0 ? ?Interim History: Kara King has been facing challenges with work.  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 ? ?Subjective:  ? ?1. Polyphagia ?Increased hunger and cravings lately because eating off plan.  Comfort foods.  Phentermine helps. ? ?2. Vitamin D deficiency ?Forgets at times to take it every other 10 days. ? 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 ? ?4. At risk for depression ?Kara King is at increased risk for constipation due to stressors. ? ?Assessment/Plan:  ?No orders of the defined types were placed in this encounter. ? ? ?Medications Discontinued During This Encounter  ?Medication Reason  ? phentermine (ADIPEX-P) 37.5 MG tablet Reorder  ? Vitamin D, Ergocalciferol, (DRISDOL) 1.25 MG (50000 UNIT) CAPS capsule Reorder  ?  ? ?Meds ordered this encounter  ?Medications  ? Vitamin D, Ergocalciferol, (DRISDOL) 1.25 MG (50000 UNIT) CAPS capsule  ?  Sig: 1 po q 10 days  ?  Dispense:  3 capsule  ?  Refill:  0  ?  Ov for rf  ? phentermine (ADIPEX-P) 37.5 MG tablet  ?  Sig: 1 tab po q am  ?  Dispense:  30 tablet  ?  Refill:  0  ?  Refill on 10/24/21- which is 30 d from last RF  ?  ? ?1. Polyphagia ?Refill phentermine but not until 10/24/2021 - written on  script. ? ?- Refill phentermine (ADIPEX-P) 37.5 MG tablet; 1 tab po q am  Dispense: 30 tablet; Refill: 0 ? ?2. Vitamin D deficiency ?Refill ergocalciferol 50,000 IU every 10 days.  Discussed techniques for better medication compliance. ? ?- Refill Vitamin D, Ergocalciferol, (DRISDOL) 1.25 MG (50000 UNIT) CAPS capsule; 1 po q 10 days  Dispense: 3 capsule; Refill: 0 ? 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  Emotional eating strategies discussed with patient.  Stress management discussed with patient. ? ?4. At risk for depression ?Kara King was given approximately 9 minutes of depression risk counseling today. We discussed the importance of a healthy work life balance, a healthy relationship with food and a good support system. ? ?Repetitive spaced learning was employed today to elicit superior memory formation and behavioral change. ? ?5. Obesity with current BMI of 31.1 ? ?Kara King is currently in the action stage of change. As such, her goal is to continue with weight loss efforts. She has agreed to the Category 1 Plan, but she will journal.  ? ?She will use MFP to journal her intake.  Recheck IC at next office visit. ? ?Exercise goals:  As is. ? ?Behavioral modification strategies: increasing lean protein intake, decreasing simple carbohydrates, increasing water intake, meal planning and cooking strategies, keeping healthy foods in the home, avoiding temptations, and planning for success. ? ?Kara King has agreed to follow-up with our  clinic in 4 weeks, fasting for IC. She was informed of the importance of frequent follow-up visits to maximize her success with intensive lifestyle modifications for her multiple health conditions.  ? ?Objective:  ? ?Blood pressure 121/72, pulse (!) 48, temperature 97.7 ?F (36.5 ?C), height '5\' 5"'$  (1.651 m), weight 187 lb (84.8 kg), last menstrual period 09/30/2016. ?Body mass index is 31.12 kg/m?. ? ?General: Cooperative, alert, well developed, in no  acute distress. ?HEENT: Conjunctivae and lids unremarkable. ?Cardiovascular: Regular rhythm.  ?Lungs: Normal work of breathing. ?Neurologic: No focal deficits.  ? ?Lab Results  ?Component Value Date  ? CREATININE 0.67 07/03/2021  ? BUN 17 07/03/2021  ? NA 142 07/03/2021  ? K 4.3 07/03/2021  ? CL 105 07/03/2021  ? CO2 23 07/03/2021  ? ?Lab Results  ?Component Value Date  ? ALT 18 12/18/2020  ? AST 23 12/18/2020  ? ALKPHOS 81 12/18/2020  ? BILITOT 0.4 12/18/2020  ? ?Lab Results  ?Component Value Date  ? HGBA1C 5.5 07/03/2021  ? HGBA1C 5.6 08/08/2020  ? ?Lab Results  ?Component Value Date  ? INSULIN 2.2 (L) 07/03/2021  ? INSULIN 4.5 08/08/2020  ? ?Lab Results  ?Component Value Date  ? TSH 1.450 08/08/2020  ? ?Lab Results  ?Component Value Date  ? CHOL 191 07/03/2021  ? HDL 70 07/03/2021  ? LDLCALC 112 (H) 07/03/2021  ? TRIG 49 07/03/2021  ? CHOLHDL 3.3 12/18/2020  ? ?Lab Results  ?Component Value Date  ? VD25OH 71.9 07/03/2021  ? VD25OH 55.6 12/18/2020  ? VD25OH 21.9 (L) 08/08/2020  ? ?Lab Results  ?Component Value Date  ? WBC 4.9 08/08/2020  ? HGB 14.0 08/08/2020  ? HCT 43.8 08/08/2020  ? MCV 91 08/08/2020  ? PLT 268 08/08/2020  ? ?Attestation Statements:  ? ?Reviewed by clinician on day of visit: allergies, medications, problem list, medical history, surgical history, family history, social history, and previous encounter notes. ? ?I, Water quality scientist, CMA, am acting as transcriptionist for [employer], DO. ? ?I have reviewed the above documentation for accuracy and completeness, and I agree with the above. Marjory Sneddon, D.O. ? ?The [place] was signed into law in 2016 which includes the topic of electronic health records.  This provides immediate access to information in MyChart.  This includes consultation notes, operative notes, office notes, lab results and pathology reports.  If you have any questions about what you read please let us know at your next visit so we can discuss your concerns  and take corrective action if need be.  We are right here with you. ? ?

## 2021-10-18 ENCOUNTER — Other Ambulatory Visit: Payer: Self-pay | Admitting: Family Medicine

## 2021-10-18 DIAGNOSIS — G43719 Chronic migraine without aura, intractable, without status migrainosus: Secondary | ICD-10-CM

## 2021-10-23 ENCOUNTER — Ambulatory Visit (INDEPENDENT_AMBULATORY_CARE_PROVIDER_SITE_OTHER): Payer: 59 | Admitting: Family Medicine

## 2021-11-05 ENCOUNTER — Encounter (INDEPENDENT_AMBULATORY_CARE_PROVIDER_SITE_OTHER): Payer: Self-pay | Admitting: Family Medicine

## 2021-11-05 ENCOUNTER — Ambulatory Visit (INDEPENDENT_AMBULATORY_CARE_PROVIDER_SITE_OTHER): Payer: 59 | Admitting: Family Medicine

## 2021-11-05 VITALS — BP 104/70 | HR 54 | Temp 97.3°F | Ht 65.0 in | Wt 186.0 lb

## 2021-11-05 DIAGNOSIS — F509 Eating disorder, unspecified: Secondary | ICD-10-CM | POA: Insufficient documentation

## 2021-11-05 DIAGNOSIS — R0602 Shortness of breath: Secondary | ICD-10-CM | POA: Diagnosis not present

## 2021-11-05 DIAGNOSIS — Z6831 Body mass index (BMI) 31.0-31.9, adult: Secondary | ICD-10-CM

## 2021-11-05 DIAGNOSIS — E669 Obesity, unspecified: Secondary | ICD-10-CM

## 2021-11-05 DIAGNOSIS — E559 Vitamin D deficiency, unspecified: Secondary | ICD-10-CM

## 2021-11-05 DIAGNOSIS — F5089 Other specified eating disorder: Secondary | ICD-10-CM | POA: Diagnosis not present

## 2021-11-05 DIAGNOSIS — R632 Polyphagia: Secondary | ICD-10-CM | POA: Diagnosis not present

## 2021-11-05 MED ORDER — VITAMIN D (ERGOCALCIFEROL) 1.25 MG (50000 UNIT) PO CAPS: ORAL_CAPSULE | ORAL | 0 refills | Status: DC

## 2021-11-05 NOTE — Patient Instructions (Signed)
Add strength training with hand weights and shoot for 2-3 days / week, * 2 sets of 10 of :  1) chest presses, 2) bicep curls, 3) tricep extensions, 4) overhead presses and 5) squats to a chair.   ?

## 2021-11-20 NOTE — Progress Notes (Signed)
? ? ? ?Chief Complaint:  ? ?OBESITY ?Kara King is here to discuss her progress with her obesity treatment plan along with follow-up of her obesity related diagnoses. Kara King is on the Category 1 Plan and states she is following her eating plan approximately 85% of the time. Kara King states she is doing [employer] for 45 minutes 4 times per week. ? ?Today's visit was #: 23 ?Starting weight: 240 lbs ?Starting date: 08/08/2020 ?Today's weight: 186 lbs ?Today's date: 11/06/2021 ?Total lbs lost to date: 51 ?Total lbs lost since last in-office visit: 1 ? ?Interim History: Kara King is here for a follow up office visit. We reviewed her meal plan and all questions were answered. Patient's food recall appears to be accurate and consistent with what is on plan when she is following it. When eating on plan, her hunger and cravings are well controlled. Repeat IC today. Results were reviewed with the patient. No need for change in meal plan. ? ?Subjective:  ? ?1. Polyphagia ?Brilyn feels her medications still helping with hunger but recognizes it is emotionally meditated.  ? ?2. Vitamin D deficiency ?She is currently taking prescription vitamin D 50,000 IU every 10 days. She denies nausea, vomiting or muscle weakness. ? ?3. SOBOE (shortness of breath on exertion) ?Kara King notes increasing shortness of breath with exercising and seems to be worsening over time with weight gain. She notes getting out of breath sooner with activity than she used to.  Kara King denies shortness of breath at rest or orthopnea. ? ?4. Other disorder of eating, with emotional eating ?Seen by Dr. Mallie Mussel in the past, and thinks seeing her own therapist again Kara King) would help. ? ?Assessment/Plan:  ?No orders of the defined types were placed in this encounter. ? ? ?Medications Discontinued During This Encounter  ?Medication Reason  ? Vitamin D, Ergocalciferol, (DRISDOL) 1.25 MG (50000 UNIT) CAPS capsule Reorder  ?  ? ?Meds ordered this  encounter  ?Medications  ? Vitamin D, Ergocalciferol, (DRISDOL) 1.25 MG (50000 UNIT) CAPS capsule  ?  Sig: 1 po q 10 days  ?  Dispense:  3 capsule  ?  Refill:  0  ?  Ov for rf  ?  ? ?1. Polyphagia ?Intensive lifestyle modifications are the first line treatment for this issue. We discussed several lifestyle modifications today. Kara King will continue phentermine 1/2-1 tablet daily, and she will continue to work on diet, exercise and weight loss efforts. Orders and follow up as documented in patient record. ? ?Counseling ?Polyphagia is excessive hunger. ?Causes can include: low blood sugars, hypERthyroidism, PMS, lack of sleep, stress, insulin resistance, diabetes, certain medications, and diets that are deficient in protein and fiber.  ? ?2. Vitamin D deficiency ?We will refill prescription Vitamin D 50,000 IU every 10 days for 1 month. She will follow-up for routine testing of Vitamin D, at least 2-3 times per year to avoid over-replacement. ? ?- Vitamin D, Ergocalciferol, (DRISDOL) 1.25 MG (50000 UNIT) CAPS capsule; 1 po q 10 days  Dispense: 3 capsule; Refill: 0 ? ?3. SOBOE (shortness of breath on exertion) ?IC was repeated today. Kara King's shortness of breath appears to be obesity related and exercise induced. She has agreed to work on weight loss and gradually increase exercise to treat her exercise induced shortness of breath. Will continue to monitor closely. ? ?4. Other disorder of eating, with emotional eating ?Kara King will continue Topamax, and restart with therapy. Behavior modification techniques were discussed today to help Kara King deal with her emotional/non-hunger eating  behaviors. Orders and follow up as documented in patient record.  ? ?5. Obesity, current BMI 31.0 ?Kara King is currently in the action stage of change. As such, her goal is to continue with weight loss efforts. She has agreed to the Category 1 Plan.  ? ?Patient will obtain labs via PCP for CPE in the near future. ? ?Exercise  goals: Add strength training 2 days per week at least plus cardio.  ? ?Behavioral modification strategies: increasing lean protein intake and increasing water intake. ? ?Kara King has agreed to follow-up with our clinic in 4 to 6 weeks. She was informed of the importance of frequent follow-up visits to maximize her success with intensive lifestyle modifications for her multiple health conditions.  ? ?Objective:  ? ?Blood pressure 104/70, pulse (!) 54, temperature (!) 97.3 ?F (36.3 ?C), height '5\' 5"'$  (1.651 m), weight 186 lb (84.4 kg), last menstrual period 09/30/2016, SpO2 98 %. ?Body mass index is 30.95 kg/m?. ? ?General: Cooperative, alert, well developed, in no acute distress. ?HEENT: Conjunctivae and lids unremarkable. ?Cardiovascular: Regular rhythm.  ?Lungs: Normal work of breathing. ?Neurologic: No focal deficits.  ? ?Lab Results  ?Component Value Date  ? CREATININE 0.67 07/03/2021  ? BUN 17 07/03/2021  ? NA 142 07/03/2021  ? K 4.3 07/03/2021  ? CL 105 07/03/2021  ? CO2 23 07/03/2021  ? ?Lab Results  ?Component Value Date  ? ALT 18 12/18/2020  ? AST 23 12/18/2020  ? ALKPHOS 81 12/18/2020  ? BILITOT 0.4 12/18/2020  ? ?Lab Results  ?Component Value Date  ? HGBA1C 5.5 07/03/2021  ? HGBA1C 5.6 08/08/2020  ? ?Lab Results  ?Component Value Date  ? INSULIN 2.2 (L) 07/03/2021  ? INSULIN 4.5 08/08/2020  ? ?Lab Results  ?Component Value Date  ? TSH 1.450 08/08/2020  ? ?Lab Results  ?Component Value Date  ? CHOL 191 07/03/2021  ? HDL 70 07/03/2021  ? LDLCALC 112 (H) 07/03/2021  ? TRIG 49 07/03/2021  ? CHOLHDL 3.3 12/18/2020  ? ?Lab Results  ?Component Value Date  ? VD25OH 71.9 07/03/2021  ? VD25OH 55.6 12/18/2020  ? VD25OH 21.9 (L) 08/08/2020  ? ?Lab Results  ?Component Value Date  ? WBC 4.9 08/08/2020  ? HGB 14.0 08/08/2020  ? HCT 43.8 08/08/2020  ? MCV 91 08/08/2020  ? PLT 268 08/08/2020  ? ?No results found for: IRON, TIBC, FERRITIN ? ?Attestation Statements:  ? ?Reviewed by clinician on day of visit: allergies,  medications, problem list, medical history, surgical history, family history, social history, and previous encounter notes. ? ? ?I, Trixie Dredge, am acting as transcriptionist for [employer], DO. ? ?I have reviewed the above documentation for accuracy and completeness, and I agree with the above. Marjory Sneddon, D.O. ? ?The [place] was signed into law in 2016 which includes the topic of electronic health records.  This provides immediate access to information in MyChart.  This includes consultation notes, operative notes, office notes, lab results and pathology reports.  If you have any questions about what you read please let us know at your next visit so we can discuss your concerns and take corrective action if need be.  We are right here with you. ? ? ?

## 2021-11-21 ENCOUNTER — Other Ambulatory Visit: Payer: Self-pay | Admitting: Family Medicine

## 2021-11-21 DIAGNOSIS — G43719 Chronic migraine without aura, intractable, without status migrainosus: Secondary | ICD-10-CM

## 2021-11-28 ENCOUNTER — Other Ambulatory Visit: Payer: Self-pay | Admitting: Family Medicine

## 2021-11-28 DIAGNOSIS — J209 Acute bronchitis, unspecified: Secondary | ICD-10-CM

## 2021-12-01 MED ORDER — ALBUTEROL SULFATE HFA 108 (90 BASE) MCG/ACT IN AERS: 2.0000 | INHALATION_SPRAY | Freq: Four times a day (QID) | RESPIRATORY_TRACT | 0 refills | Status: DC | PRN

## 2021-12-03 ENCOUNTER — Encounter (INDEPENDENT_AMBULATORY_CARE_PROVIDER_SITE_OTHER): Payer: Self-pay | Admitting: Family Medicine

## 2021-12-03 ENCOUNTER — Ambulatory Visit (INDEPENDENT_AMBULATORY_CARE_PROVIDER_SITE_OTHER): Payer: 59 | Admitting: Family Medicine

## 2021-12-03 VITALS — BP 111/58 | HR 52 | Temp 98.3°F | Ht 65.0 in | Wt 189.0 lb

## 2021-12-03 DIAGNOSIS — E669 Obesity, unspecified: Secondary | ICD-10-CM | POA: Diagnosis not present

## 2021-12-03 DIAGNOSIS — E66812 Obesity, class 2: Secondary | ICD-10-CM

## 2021-12-03 DIAGNOSIS — E559 Vitamin D deficiency, unspecified: Secondary | ICD-10-CM | POA: Diagnosis not present

## 2021-12-03 DIAGNOSIS — Z6831 Body mass index (BMI) 31.0-31.9, adult: Secondary | ICD-10-CM

## 2021-12-03 DIAGNOSIS — R7301 Impaired fasting glucose: Secondary | ICD-10-CM | POA: Diagnosis not present

## 2021-12-03 DIAGNOSIS — R632 Polyphagia: Secondary | ICD-10-CM

## 2021-12-03 DIAGNOSIS — Z9189 Other specified personal risk factors, not elsewhere classified: Secondary | ICD-10-CM

## 2021-12-03 MED ORDER — WEGOVY 0.25 MG/0.5ML ~~LOC~~ SOAJ: 0.2500 mg | SUBCUTANEOUS | 0 refills | Status: DC

## 2021-12-03 MED ORDER — VITAMIN D (ERGOCALCIFEROL) 1.25 MG (50000 UNIT) PO CAPS: ORAL_CAPSULE | ORAL | 0 refills | Status: DC

## 2021-12-09 ENCOUNTER — Other Ambulatory Visit: Payer: Self-pay | Admitting: Family Medicine

## 2021-12-09 ENCOUNTER — Telehealth (INDEPENDENT_AMBULATORY_CARE_PROVIDER_SITE_OTHER): Payer: Self-pay | Admitting: Family Medicine

## 2021-12-09 ENCOUNTER — Encounter (INDEPENDENT_AMBULATORY_CARE_PROVIDER_SITE_OTHER): Payer: Self-pay

## 2021-12-09 DIAGNOSIS — G43719 Chronic migraine without aura, intractable, without status migrainosus: Secondary | ICD-10-CM

## 2021-12-09 MED ORDER — RIZATRIPTAN BENZOATE 10 MG PO TABS: ORAL_TABLET | ORAL | 0 refills | Status: DC

## 2021-12-09 NOTE — Telephone Encounter (Signed)
Dr. Raliegh Scarlet - Prior authorization denied for [facility]. Per insurance: Drug not covered/plan exclusion. Patient sent denial message via mychart.

## 2021-12-12 NOTE — Progress Notes (Signed)
Chief Complaint:   OBESITY Kara King is here to discuss her progress with her obesity treatment plan along with follow-up of her obesity related diagnoses. Kara King is on the Category 1 Plan and states she is following her eating plan approximately 50% of the time. Kara King states she is doing cardio and boot camp 45 minutes 4 times per week.  Today's visit was #: 24 Starting weight: 240 lbs Starting date: 08/08/2020 Today's weight: 189 lbs Today's date: 12/03/2021 Total lbs lost to date: 51 Total lbs lost since last in-office visit: +3  Interim History: Kara King is up 3 lbs, stating she "fell off the wagon:". She has increased her strength training since her last OV 4 weeks ago and gained 1.6 lbs in muscle mass.  Subjective:   1. Impaired fasting glucose with polyphagia Kara King has failed Metformin due to GI side effects of diarrhea and no help with medical condition. She has very little help on phentermine now and wishes to wean off, especially due to not sleeping well.  2. Vitamin D deficiency Pt's Vit D level was 71 over 5 months ago. She will be getting labs done with PCP in the near future.  3. At risk for impaired metabolic function Kara King is at increased risk for impaired metabolic function due to impaired fasting glucose.  Assessment/Plan:  No orders of the defined types were placed in this encounter.   Medications Discontinued During This Encounter  Medication Reason   Vitamin D, Ergocalciferol, (DRISDOL) 1.25 MG (50000 UNIT) CAPS capsule Reorder     Meds ordered this encounter  Medications   Vitamin D, Ergocalciferol, (DRISDOL) 1.25 MG (50000 UNIT) CAPS capsule    Sig: 1 po q 10 days    Dispense:  3 capsule    Refill:  0    Ov for rf   Semaglutide-Weight Management (WEGOVY) 0.25 MG/0.5ML SOAJ    Sig: Inject 0.25 mg into the skin once a week.    Dispense:  2 mL    Refill:  0     1. Impaired fasting glucose with polyphagia Start Wegovy 0.25 mg  weekly, as per below. Follow prudent nutritional plan, continue with exercise, and weight loss.  Start- Semaglutide-Weight Management (WEGOVY) 0.25 MG/0.5ML SOAJ; Inject 0.25 mg into the skin once a week.  Dispense: 2 mL; Refill: 0  2. Vitamin D deficiency Low Vitamin D level contributes to fatigue and are associated with obesity, breast, and colon cancer. She agrees to continue to take prescription Vitamin D '@50'$ ,000 IU every 10 days and will follow-up for routine testing of Vitamin D, at least 2-3 times per year to avoid over-replacement.  Refill- Vitamin D, Ergocalciferol, (DRISDOL) 1.25 MG (50000 UNIT) CAPS capsule; 1 po q 10 days  Dispense: 3 capsule; Refill: 0  3. At risk for impaired metabolic function Due to Kara King's current state of health and medical condition(s), she is at a significantly higher risk for impaired metabolic function. At least 12 minutes was spent on counseling Donie about these concerns today. This places the patient at a much greater risk to subsequently develop cardio-pulmonary conditions that can negatively affect the patient's quality of life. I stressed the importance of reversing these risks factors.  The initial goal is to lose at least 5-10% of starting weight to help reduce risk factors. Counseling:  Intensive lifestyle modifications discussed with [name] as the most appropriate first line treatment. She will continue to work on diet, exercise, and weight loss efforts. We will continue to reassess these  conditions on a fairly regular basis in an attempt to decrease the patient's overall morbidity and mortality.  4. Obesity, current BMI 31.5 [name] is currently in the action stage of change. As such, her goal is to continue with weight loss efforts. She has agreed to the Category 1 Plan.   Exercise goals:  As is  Behavioral modification strategies: increasing lean protein intake and decreasing simple carbohydrates.  Kara King has agreed to follow-up  with our clinic in 4 weeks. She was informed of the importance of frequent follow-up visits to maximize her success with intensive lifestyle modifications for her multiple health conditions.   Objective:   Blood pressure (!) 111/58, pulse (!) 52, temperature 98.3 F (36.8 C), height '5\' 5"'$  (1.651 m), weight 189 lb (85.7 kg), last menstrual period 09/30/2016, SpO2 96 %. Body mass index is 31.45 kg/m.  General: Cooperative, alert, well developed, in no acute distress. HEENT: Conjunctivae and lids unremarkable. Cardiovascular: Regular rhythm.  Lungs: Normal work of breathing. Neurologic: No focal deficits.   Lab Results  Component Value Date   CREATININE 0.67 07/03/2021   BUN 17 07/03/2021   NA 142 07/03/2021   K 4.3 07/03/2021   CL 105 07/03/2021   CO2 23 07/03/2021   Lab Results  Component Value Date   ALT 18 12/18/2020   AST 23 12/18/2020   ALKPHOS 81 12/18/2020   BILITOT 0.4 12/18/2020   Lab Results  Component Value Date   HGBA1C 5.5 07/03/2021   HGBA1C 5.6 08/08/2020   Lab Results  Component Value Date   INSULIN 2.2 (L) 07/03/2021   INSULIN 4.5 08/08/2020   Lab Results  Component Value Date   TSH 1.450 08/08/2020   Lab Results  Component Value Date   CHOL 191 07/03/2021   HDL 70 07/03/2021   LDLCALC 112 (H) 07/03/2021   TRIG 49 07/03/2021   CHOLHDL 3.3 12/18/2020   Lab Results  Component Value Date   VD25OH 71.9 07/03/2021   VD25OH 55.6 12/18/2020   VD25OH 21.9 (L) 08/08/2020   Lab Results  Component Value Date   WBC 4.9 08/08/2020   HGB 14.0 08/08/2020   HCT 43.8 08/08/2020   MCV 91 08/08/2020   PLT 268 08/08/2020    Attestation Statements:   Reviewed by clinician on day of visit: allergies, medications, problem list, medical history, surgical history, family history, social history, and previous encounter notes.  I, Kathlene November, BS, CMA, am acting as transcriptionist for [employer], DO.  I have reviewed the above documentation for  accuracy and completeness, and I agree with the above. Marjory Sneddon, D.O.  The [place] was signed into law in 2016 which includes the topic of electronic health records.  This provides immediate access to information in MyChart.  This includes consultation notes, operative notes, office notes, lab results and pathology reports.  If you have any questions about what you read please let us know at your next visit so we can discuss your concerns and take corrective action if need be.  We are right here with you.

## 2021-12-29 ENCOUNTER — Encounter (INDEPENDENT_AMBULATORY_CARE_PROVIDER_SITE_OTHER): Payer: Self-pay | Admitting: Family Medicine

## 2021-12-29 ENCOUNTER — Ambulatory Visit (INDEPENDENT_AMBULATORY_CARE_PROVIDER_SITE_OTHER): Payer: 59 | Admitting: Family Medicine

## 2021-12-29 VITALS — BP 100/64 | HR 58 | Temp 98.9°F | Ht 65.0 in | Wt 192.0 lb

## 2021-12-29 DIAGNOSIS — R632 Polyphagia: Secondary | ICD-10-CM

## 2021-12-29 DIAGNOSIS — Z6832 Body mass index (BMI) 32.0-32.9, adult: Secondary | ICD-10-CM | POA: Diagnosis not present

## 2021-12-29 DIAGNOSIS — E669 Obesity, unspecified: Secondary | ICD-10-CM

## 2021-12-29 DIAGNOSIS — E559 Vitamin D deficiency, unspecified: Secondary | ICD-10-CM

## 2021-12-29 DIAGNOSIS — Z9189 Other specified personal risk factors, not elsewhere classified: Secondary | ICD-10-CM

## 2021-12-29 MED ORDER — PHENTERMINE HCL 37.5 MG PO TABS: ORAL_TABLET | ORAL | 0 refills | Status: DC

## 2022-02-02 ENCOUNTER — Encounter (INDEPENDENT_AMBULATORY_CARE_PROVIDER_SITE_OTHER): Payer: Self-pay | Admitting: Family Medicine

## 2022-02-02 ENCOUNTER — Ambulatory Visit (INDEPENDENT_AMBULATORY_CARE_PROVIDER_SITE_OTHER): Payer: 59 | Admitting: Family Medicine

## 2022-02-02 VITALS — BP 106/71 | HR 51 | Temp 97.7°F | Ht 65.0 in | Wt 193.0 lb

## 2022-02-02 DIAGNOSIS — F4329 Adjustment disorder with other symptoms: Secondary | ICD-10-CM | POA: Diagnosis not present

## 2022-02-02 DIAGNOSIS — R632 Polyphagia: Secondary | ICD-10-CM | POA: Diagnosis not present

## 2022-02-02 DIAGNOSIS — E559 Vitamin D deficiency, unspecified: Secondary | ICD-10-CM

## 2022-02-02 DIAGNOSIS — E669 Obesity, unspecified: Secondary | ICD-10-CM

## 2022-02-02 DIAGNOSIS — R69 Illness, unspecified: Secondary | ICD-10-CM | POA: Diagnosis not present

## 2022-02-02 DIAGNOSIS — Z6832 Body mass index (BMI) 32.0-32.9, adult: Secondary | ICD-10-CM | POA: Diagnosis not present

## 2022-02-02 DIAGNOSIS — Z9189 Other specified personal risk factors, not elsewhere classified: Secondary | ICD-10-CM

## 2022-02-02 MED ORDER — VITAMIN D (ERGOCALCIFEROL) 1.25 MG (50000 UNIT) PO CAPS: ORAL_CAPSULE | ORAL | 0 refills | Status: DC

## 2022-02-02 MED ORDER — PHENTERMINE HCL 37.5 MG PO TABS: ORAL_TABLET | ORAL | 0 refills | Status: DC

## 2022-02-03 ENCOUNTER — Telehealth: Payer: Self-pay | Admitting: Family Medicine

## 2022-02-03 DIAGNOSIS — Z01419 Encounter for gynecological examination (general) (routine) without abnormal findings: Secondary | ICD-10-CM | POA: Diagnosis not present

## 2022-02-03 DIAGNOSIS — R69 Illness, unspecified: Secondary | ICD-10-CM | POA: Diagnosis not present

## 2022-02-03 DIAGNOSIS — Z6833 Body mass index (BMI) 33.0-33.9, adult: Secondary | ICD-10-CM | POA: Diagnosis not present

## 2022-02-03 DIAGNOSIS — Z1231 Encounter for screening mammogram for malignant neoplasm of breast: Secondary | ICD-10-CM | POA: Diagnosis not present

## 2022-02-03 DIAGNOSIS — Z124 Encounter for screening for malignant neoplasm of cervix: Secondary | ICD-10-CM | POA: Diagnosis not present

## 2022-02-03 DIAGNOSIS — G43719 Chronic migraine without aura, intractable, without status migrainosus: Secondary | ICD-10-CM

## 2022-02-03 LAB — HM PAP SMEAR: HPV, high-risk: NEGATIVE

## 2022-02-03 LAB — HM MAMMOGRAPHY

## 2022-02-05 ENCOUNTER — Encounter: Payer: Self-pay | Admitting: Family Medicine

## 2022-02-05 MED ORDER — RIZATRIPTAN BENZOATE 10 MG PO TABS: ORAL_TABLET | ORAL | 0 refills | Status: DC

## 2022-02-05 NOTE — Telephone Encounter (Signed)
Refills sent

## 2022-02-05 NOTE — Progress Notes (Signed)
Chief Complaint:   OBESITY Kara King is here to discuss her progress with her obesity treatment plan along with follow-up of her obesity related diagnoses. Kara King is on the Category 1 Plan and states she is following her eating plan approximately 50% of the time. Kara King states she is doing boot camp 45 minutes 4 times per week.  Today's visit was #: 80 Starting weight: 240 lbs Starting date: 08/08/2020 Today's weight: 193 lbs Today's date: 02/02/2022 Total lbs lost to date: 47 Total lbs lost since last in-office visit: +1  Interim History: Kara King is doing better in terms of dealing with life stressors (change in job). She feels she is ready to get back on plan and with her life (self-care activities).  Subjective:   1. Polyphagia [name] is on phentermine and tolerating it well with no issues.  2. Vitamin D deficiency She is currently taking prescription vitamin D 50,000 IU every 10 days. She denies nausea, vomiting or muscle weakness.  3. Stress and adjustment reaction Pt cannot tolerate Wellbutrin and was on Zoloft in past and did well with it.  4. At risk for dehydration Kara King is at risk for dehydration due to inadequate hydration.  Assessment/Plan:  No orders of the defined types were placed in this encounter.   Medications Discontinued During This Encounter  Medication Reason   Vitamin D, Ergocalciferol, (DRISDOL) 1.25 MG (50000 UNIT) CAPS capsule Reorder   phentermine (ADIPEX-P) 37.5 MG tablet Reorder     Meds ordered this encounter  Medications   Vitamin D, Ergocalciferol, (DRISDOL) 1.25 MG (50000 UNIT) CAPS capsule    Sig: 1 po q 10 days    Dispense:  3 capsule    Refill:  0    Ov for rf   phentermine (ADIPEX-P) 37.5 MG tablet    Sig: 1 tab po q am    Dispense:  30 tablet    Refill:  0     1. Polyphagia Intensive lifestyle modifications are the first line treatment for this issue. We discussed several lifestyle modifications today and  she will continue to work on diet, exercise and weight loss efforts. Orders and follow up as documented in patient record. Continue phentermine 37.5 mg.  Counseling Polyphagia is excessive hunger. Causes can include: low blood sugars, hypERthyroidism, PMS, lack of sleep, stress, insulin resistance, diabetes, certain medications, and diets that are deficient in protein and fiber.   Refill- phentermine (ADIPEX-P) 37.5 MG tablet; 1 tab po q am  Dispense: 30 tablet; Refill: 0  2. Vitamin D deficiency Low Vitamin D level contributes to fatigue and are associated with obesity, breast, and colon cancer. She agrees to continue to take prescription Vitamin D '@50'$ ,000 IU every week and will follow-up for routine testing of Vitamin D, at least 2-3 times per year to avoid over-replacement.  Refill- Vitamin D, Ergocalciferol, (DRISDOL) 1.25 MG (50000 UNIT) CAPS capsule; 1 po q 10 days  Dispense: 3 capsule; Refill: 0  3. Stress and adjustment reaction Pt will contact a counselor and to every 2 weeks. She will consider Prozac in the future or other med if needed.  4. At risk for dehydration Kara King is at higher than average risk of dehydration.  Kara King was given more than 9 minutes of proper hydration counseling today.  We discussed the signs and symptoms of dehydration, some of which may include muscle cramping, constipation or even orthostatic symptoms.  Counseling on the prevention of dehydration was also provided today.  Kara King is at risk for  dehydration due to weight loss, lifestyle and behavorial habits and possibly due to taking certain medication(s).  She was encouraged to adequately hydrate and monitor fluid status to avoid dehydration as well as weight loss plateaus.  Unless pre-existing renal or cardiopulmonary conditions exist, in which patient was told to limit their fluid intake, I recommended roughly one half of their weight in pounds to be the approximate ounces of non-caloric,  non-caffeinated beverages they should drink per day; including more if they are engaging in exercise.  Kara King is at higher than average risk of dehydration.  Kara King was given more than 9 minutes of proper hydration counseling today.  We discussed the signs and symptoms of dehydration, some of which may include muscle cramping, constipation, or even orthostatic symptoms.  Counseling on the prevention of dehydration was also provided today.  Kara King is at risk for dehydration due to weight loss, lifestyle and behavorial habits, and possibly due to taking certain medication(s).  She was encouraged to adequately hydrate and monitor fluid status to avoid dehydration as well as weight loss plateaus.  Unless pre-existing renal or cardiopulmonary conditions exist, which pt was told to limit their fluid intake.  I recommended roughly one half of their weight in pounds to be the approximate ounces of non-caloric, non-caffeinated beverages they should drink per day; including more if they are engaging in exercise.  5. Obesity, current BMI 32.1 Kara King is currently in the action stage of change. As such, her goal is to continue with weight loss efforts. She has agreed to the Category 1 Plan.   Pt declines labs today, as she will have then done with Dr. Carollee Herter in the future.  Exercise goals:  As is  Behavioral modification strategies: planning for success.  [name] has agreed to follow-up with our clinic in 3 weeks. She was informed of the importance of frequent follow-up visits to maximize her success with intensive lifestyle modifications for her multiple health conditions.   Objective:   Blood pressure 106/71, pulse (!) 51, temperature 97.7 F (36.5 C), height '5\' 5"'$  (1.651 m), weight 193 lb (87.5 kg), last menstrual period 09/30/2016, SpO2 98 %. Body mass index is 32.12 kg/m.  General: Cooperative, alert, well developed, in no acute distress. HEENT: Conjunctivae and lids  unremarkable. Cardiovascular: Regular rhythm.  Lungs: Normal work of breathing. Neurologic: No focal deficits.   Lab Results  Component Value Date   CREATININE 0.67 07/03/2021   BUN 17 07/03/2021   NA 142 07/03/2021   K 4.3 07/03/2021   CL 105 07/03/2021   CO2 23 07/03/2021   Lab Results  Component Value Date   ALT 18 12/18/2020   AST 23 12/18/2020   ALKPHOS 81 12/18/2020   BILITOT 0.4 12/18/2020   Lab Results  Component Value Date   HGBA1C 5.5 07/03/2021   HGBA1C 5.6 08/08/2020   Lab Results  Component Value Date   INSULIN 2.2 (L) 07/03/2021   INSULIN 4.5 08/08/2020   Lab Results  Component Value Date   TSH 1.450 08/08/2020   Lab Results  Component Value Date   CHOL 191 07/03/2021   HDL 70 07/03/2021   LDLCALC 112 (H) 07/03/2021   TRIG 49 07/03/2021   CHOLHDL 3.3 12/18/2020   Lab Results  Component Value Date   VD25OH 71.9 07/03/2021   VD25OH 55.6 12/18/2020   VD25OH 21.9 (L) 08/08/2020   Lab Results  Component Value Date   WBC 4.9 08/08/2020   HGB 14.0 08/08/2020   HCT 43.8 08/08/2020  MCV 91 08/08/2020   PLT 268 08/08/2020    Attestation Statements:   Reviewed by clinician on day of visit: allergies, medications, problem list, medical history, surgical history, family history, social history, and previous encounter notes.  I, Kathlene November, BS, CMA, am acting as transcriptionist for [employer], DO.   I have reviewed the above documentation for accuracy and completeness, and I agree with the above. Marjory Sneddon, D.O.  The [place] was signed into law in 2016 which includes the topic of electronic health records.  This provides immediate access to information in MyChart.  This includes consultation notes, operative notes, office notes, lab results and pathology reports.  If you have any questions about what you read please let us know at your next visit so we can discuss your concerns and take corrective action if need be.   We are right here with you.

## 2022-02-05 NOTE — Addendum Note (Signed)
Addended by: Sanda Linger on: 02/05/2022 08:21 AM   Modules accepted: Orders

## 2022-02-05 NOTE — Telephone Encounter (Signed)
Patient called to make Dr. Etter Sjogren aware that she has a migraine and really needs the Ritatriptan refilled. Patient has an appointment on  07/31 and would like to know if she can have some until then. She would like a call back to know if she'll be able to get some of the medication. Please advise.     [address]  [place], [place] [address]  Phone:  [phone]  Fax:  [phone]

## 2022-02-09 ENCOUNTER — Ambulatory Visit (INDEPENDENT_AMBULATORY_CARE_PROVIDER_SITE_OTHER): Payer: 59 | Admitting: Family Medicine

## 2022-02-09 ENCOUNTER — Encounter: Payer: Self-pay | Admitting: Family Medicine

## 2022-02-09 VITALS — BP 110/84 | HR 56 | Temp 97.8°F | Resp 18 | Ht 65.0 in | Wt 198.8 lb

## 2022-02-09 DIAGNOSIS — E559 Vitamin D deficiency, unspecified: Secondary | ICD-10-CM

## 2022-02-09 DIAGNOSIS — G43719 Chronic migraine without aura, intractable, without status migrainosus: Secondary | ICD-10-CM

## 2022-02-09 DIAGNOSIS — Z0000 Encounter for general adult medical examination without abnormal findings: Secondary | ICD-10-CM | POA: Diagnosis not present

## 2022-02-09 DIAGNOSIS — Z6839 Body mass index (BMI) 39.0-39.9, adult: Secondary | ICD-10-CM

## 2022-02-09 DIAGNOSIS — E7849 Other hyperlipidemia: Secondary | ICD-10-CM

## 2022-02-09 MED ORDER — TOPIRAMATE 50 MG PO TABS: 50.0000 mg | ORAL_TABLET | Freq: Two times a day (BID) | ORAL | 3 refills | Status: AC

## 2022-02-09 NOTE — Progress Notes (Signed)
Subjective:   By signing my name below, I, Carylon Perches, attest that this documentation has been prepared under the direction and in the presence of Ann Held DO 02/09/2022   Patient ID: Kara King, female    DOB: 1966/04/24, 56 y.o.   MRN: [id number]  No chief complaint on file.   HPI Patient is in today for a comprehensive physical exam  She denies having any fever, new muscle pain, joint pain , new moles, congestion, sinus pain, sore throat, chest pain, palpations, cough, SOB ,wheezing,n/v/d constipation, blood in stool, dysuria, frequency, hematuria, at this time  Social history: Colonoscopy last completed on 09/12/2021 Pap Smear last completed on 08/14/2015 Mammogram last completed on 08/14/2015 Immunizations: Diet: Exercise: Dental: Vision:   Past Medical History:  Diagnosis Date   Allergy    Asthma    Constipation    Depression    Migraine headache    SOBOE (shortness of breath on exertion)    Swelling of both lower extremities    Varicose veins    Vitamin D deficiency     Past Surgical History:  Procedure Laterality Date   G 3 P 3     TUBAL LIGATION      Family History  Adopted: Yes  Problem Relation Age of Onset   Alcohol abuse Father    Heart attack Brother 69        smoker   Heart disease Brother    Hypertension Brother    Uterine cancer Maternal Grandmother    Cancer Maternal Grandmother    Stroke Paternal Grandmother        > 90   Diabetes Paternal Grandmother    Colon cancer Neg Hx    Esophageal cancer Neg Hx    Rectal cancer Neg Hx    Stomach cancer Neg Hx     Social History   Socioeconomic History   Marital status: Single    Spouse name: Not on file   Number of children: 3   Years of education: Not on file   Highest education level: Not on file  Occupational History   Occupation: Glass blower/designer & sales    Comment: [employer]  Tobacco Use   Smoking status: Former    Packs/day: 2.00    Types:  Cigarettes    Quit date: 01/03/2011    Years since quitting: 11.1   Smokeless tobacco: Never   Tobacco comments:    smoked age [age] (not with pregnancies , up to 2 ppd  Vaping Use   Vaping Use: Never used  Substance and Sexual Activity   Alcohol use: Yes    Comment: 1-2 times a month   Drug use: No   Sexual activity: Never    Partners: Female  Other Topics Concern   Not on file  Social History Narrative   Not on file   Social Determinants of Health   Financial Resource Strain: Not on file  Food Insecurity: Not on file  Transportation Needs: Not on file  Physical Activity: Not on file  Stress: Not on file  Social Connections: Not on file  Intimate Partner Violence: Not on file    Outpatient Medications Prior to Visit  Medication Sig Dispense Refill   albuterol (VENTOLIN HFA) 108 (90 Base) MCG/ACT inhaler Inhale 2 puffs into the lungs every 6 (six) hours as needed for shortness of breath or wheezing (cough). 18 g 0   GLUCOSAMINE-CHONDROITIN PO Take 1 capsule by mouth 2 (two) times daily.  Magnesium Sulfate, Laxative, POWD Use CALM supp q hs prn.  ( 325 of Magnesium per serving )  0   Multiple Vitamin (MULTIVITAMIN) tablet Take 1 tablet by mouth daily.     NONFORMULARY OR COMPOUNDED ITEM Compression stocking 20-62m/hg #1  As directed 1 each 0   phentermine (ADIPEX-P) 37.5 MG tablet 1 tab po q am 30 tablet 0   Polyethylene Glycol 3350 (MIRALAX PO) Take by mouth. daily     rizatriptan (MAXALT) 10 MG tablet May repeat in 2 hours if needed 12 tablet 0   topiramate (TOPAMAX) 50 MG tablet Take 1 tablet by mouth twice daily 180 tablet 0   Vitamin D, Ergocalciferol, (DRISDOL) 1.25 MG (50000 UNIT) CAPS capsule 1 po q 10 days 3 capsule 0   No facility-administered medications prior to visit.    Allergies  Allergen Reactions   Wellbutrin [Bupropion]     01/30/13 dyspnea (MyChart message)   Effexor [Venlafaxine]     "Confusion"    Review of Systems  Constitutional:   Negative for fever.  HENT:  Negative for congestion, sinus pain and sore throat.   Respiratory:  Negative for cough, shortness of breath and wheezing.   Cardiovascular:  Negative for chest pain and palpitations.  Gastrointestinal:  Negative for blood in stool, constipation, diarrhea, nausea and vomiting.  Genitourinary:  Negative for dysuria, frequency and hematuria.  Musculoskeletal:  Negative for joint pain and myalgias.  Skin:        (-) New Moles       Objective:    Physical Exam Constitutional:      General: She is not in acute distress.    Appearance: Normal appearance. She is not ill-appearing.  HENT:     Head: Normocephalic and atraumatic.     Right Ear: Tympanic membrane, ear canal and external ear normal.     Left Ear: Tympanic membrane, ear canal and external ear normal.  Eyes:     Extraocular Movements: Extraocular movements intact.     Pupils: Pupils are equal, round, and reactive to light.  Cardiovascular:     Rate and Rhythm: Normal rate and regular rhythm.     Heart sounds: Normal heart sounds. No murmur heard.    No gallop.  Pulmonary:     Effort: Pulmonary effort is normal. No respiratory distress.     Breath sounds: Normal breath sounds. No wheezing or rales.  Abdominal:     General: Bowel sounds are normal. There is no distension.     Palpations: Abdomen is soft.     Tenderness: There is no abdominal tenderness. There is no guarding.  Skin:    General: Skin is warm and dry.  Neurological:     Mental Status: She is alert and oriented to person, place, and time.  Psychiatric:        Judgment: Judgment normal.     LMP 09/30/2016  Wt Readings from Last 3 Encounters:  02/02/22 193 lb (87.5 kg)  12/29/21 192 lb (87.1 kg)  12/03/21 189 lb (85.7 kg)    Diabetic Foot Exam - Simple   No data filed    Lab Results  Component Value Date   WBC 4.9 08/08/2020   HGB 14.0 08/08/2020   HCT 43.8 08/08/2020   PLT 268 08/08/2020   GLUCOSE 82 07/03/2021    CHOL 191 07/03/2021   TRIG 49 07/03/2021   HDL 70 07/03/2021   LDLCALC 112 (H) 07/03/2021   ALT 18 12/18/2020   AST 23 12/18/2020  NA 142 07/03/2021   K 4.3 07/03/2021   CL 105 07/03/2021   CREATININE 0.67 07/03/2021   BUN 17 07/03/2021   CO2 23 07/03/2021   TSH 1.450 08/08/2020   HGBA1C 5.5 07/03/2021    Lab Results  Component Value Date   TSH 1.450 08/08/2020   Lab Results  Component Value Date   WBC 4.9 08/08/2020   HGB 14.0 08/08/2020   HCT 43.8 08/08/2020   MCV 91 08/08/2020   PLT 268 08/08/2020   Lab Results  Component Value Date   NA 142 07/03/2021   K 4.3 07/03/2021   CO2 23 07/03/2021   GLUCOSE 82 07/03/2021   BUN 17 07/03/2021   CREATININE 0.67 07/03/2021   BILITOT 0.4 12/18/2020   ALKPHOS 81 12/18/2020   AST 23 12/18/2020   ALT 18 12/18/2020   PROT 6.2 12/18/2020   ALBUMIN 4.4 12/18/2020   CALCIUM 9.1 07/03/2021   EGFR 103 07/03/2021   GFR 94.67 07/30/2020   Lab Results  Component Value Date   CHOL 191 07/03/2021   Lab Results  Component Value Date   HDL 70 07/03/2021   Lab Results  Component Value Date   LDLCALC 112 (H) 07/03/2021   Lab Results  Component Value Date   TRIG 49 07/03/2021   Lab Results  Component Value Date   CHOLHDL 3.3 12/18/2020   Lab Results  Component Value Date   HGBA1C 5.5 07/03/2021       Assessment & Plan:   Problem List Items Addressed This Visit   None    No orders of the defined types were placed in this encounter.   I, Carylon Perches, personally preformed the services described in this documentation.  All medical record entries made by the scribe were at my direction and in my presence.  I have reviewed the chart and discharge instructions (if applicable) and agree that the record reflects my personal performance and is accurate and complete. 02/09/2022   I,Amber Collins,acting as a scribe for [employer], DO.,have documented all relevant documentation on the behalf of Ann Held, DO,as directed by  Ann Held, DO while in the presence of Ann Held, DO.    [employer]

## 2022-02-09 NOTE — Progress Notes (Signed)
Subjective:     Kara King is a 56 y.o. female and is here for a comprehensive physical exam. The patient reports no problems.  Social History   Socioeconomic History   Marital status: Single    Spouse name: Not on file   Number of children: 3   Years of education: Not on file   Highest education level: Not on file  Occupational History   Occupation: Glass blower/designer & sales    Comment: [employer]  Tobacco Use   Smoking status: Former    Packs/day: 2.00    Types: Cigarettes    Quit date: 01/03/2011    Years since quitting: 11.1   Smokeless tobacco: Never   Tobacco comments:    smoked age [age] (not with pregnancies , up to 2 ppd  Vaping Use   Vaping Use: Never used  Substance and Sexual Activity   Alcohol use: Yes    Comment: 1-2 times a month   Drug use: No   Sexual activity: Never    Partners: Female  Other Topics Concern   Not on file  Social History Narrative   Exericse---  4x a week    Social Determinants of Health   Financial Resource Strain: Not on file  Food Insecurity: Not on file  Transportation Needs: Not on file  Physical Activity: Not on file  Stress: Not on file  Social Connections: Not on file  Intimate Partner Violence: Not on file   Health Maintenance  Topic Date Due   HIV Screening  Never done   Hepatitis C Screening  Never done   COVID-19 Vaccine (3 - Moderna series) 05/17/2020   INFLUENZA VACCINE  02/10/2022   MAMMOGRAM  02/05/2024   PAP SMEAR-Modifier  02/04/2025   COLONOSCOPY (Pts 45-49yr Insurance coverage will need to be confirmed)  09/13/2026   TETANUS/TDAP  01/29/2031   Zoster Vaccines- Shingrix  Completed   HPV VACCINES  Aged Out    The following portions of the patient's history were reviewed and updated as appropriate: She  has a past medical history of Allergy, Asthma, Constipation, Depression, Migraine headache, SOBOE (shortness of breath on exertion), Swelling of both lower extremities, Varicose veins, and Vitamin D  deficiency. She does not have any pertinent problems on file. She  has a past surgical history that includes Tubal ligation and G 3 P 3. Her family history includes Alcohol abuse in her father; Cancer in her maternal grandmother; Diabetes in her paternal grandmother; Heart attack (age of onset: 418 in her brother; Heart disease in her brother; Hypertension in her brother; Stroke in her paternal grandmother; Uterine cancer in her maternal grandmother. She was adopted. She  reports that she quit smoking about 11 years ago. Her smoking use included cigarettes. She smoked an average of 2 packs per day. She has never used smokeless tobacco. She reports current alcohol use. She reports that she does not use drugs. She has a current medication list which includes the following prescription(s): albuterol, multivitamin, NONFORMULARY OR COMPOUNDED ITEM, phentermine, polyethylene glycol 3350, rizatriptan, vitamin d (ergocalciferol), and topiramate. Current Outpatient Medications on File Prior to Visit  Medication Sig Dispense Refill   albuterol (VENTOLIN HFA) 108 (90 Base) MCG/ACT inhaler Inhale 2 puffs into the lungs every 6 (six) hours as needed for shortness of breath or wheezing (cough). 18 g 0   Multiple Vitamin (MULTIVITAMIN) tablet Take 1 tablet by mouth daily.     NONFORMULARY OR COMPOUNDED ITEM Compression stocking 20-371mhg #1  As directed  1 each 0   phentermine (ADIPEX-P) 37.5 MG tablet 1 tab po q am 30 tablet 0   Polyethylene Glycol 3350 (MIRALAX PO) Take by mouth. daily     rizatriptan (MAXALT) 10 MG tablet May repeat in 2 hours if needed 12 tablet 0   Vitamin D, Ergocalciferol, (DRISDOL) 1.25 MG (50000 UNIT) CAPS capsule 1 po q 10 days 3 capsule 0   No current facility-administered medications on file prior to visit.   She is allergic to wellbutrin [bupropion] and effexor [venlafaxine]..  Review of Systems Review of Systems  Constitutional: Negative for activity change, appetite change and  fatigue.  HENT: Negative for hearing loss, congestion, tinnitus and ear discharge.  dentist q16mEyes: Negative for visual disturbance (see optho q1y -- vision corrected to 20/20 with glasses).  Respiratory: Negative for cough, chest tightness and shortness of breath.   Cardiovascular: Negative for chest pain, palpitations and leg swelling.  Gastrointestinal: Negative for abdominal pain, diarrhea, constipation and abdominal distention.  Genitourinary: Negative for urgency, frequency, decreased urine volume and difficulty urinating.  Musculoskeletal: Negative for back pain, arthralgias and gait problem.  Skin: Negative for color change, pallor and rash.  Neurological: Negative for dizziness, light-headedness, numbness and headaches.  Hematological: Negative for adenopathy. Does not bruise/bleed easily.  Psychiatric/Behavioral: Negative for suicidal ideas, confusion, sleep disturbance, self-injury, dysphoric mood, decreased concentration and agitation.     Objective:    BP 110/84 (BP Location: Right Arm, Patient Position: Sitting, Cuff Size: Normal)   Pulse (!) 56   Temp 97.8 F (36.6 C) (Oral)   Resp 18   Ht '5\' 5"'$  (1.651 m)   Wt 198 lb 12.8 oz (90.2 kg)   LMP 09/30/2016   SpO2 98%   BMI 33.08 kg/m  General appearance: alert, cooperative, appears stated age, and no distress Head: Normocephalic, without obvious abnormality, atraumatic Eyes: conjunctivae/corneas clear. PERRL, EOM's intact. Fundi benign. Ears: normal TM's and external ear canals both ears Nose: Nares normal. Septum midline. Mucosa normal. No drainage or sinus tenderness. Throat: lips, mucosa, and tongue normal; teeth and gums normal Neck: no adenopathy, no carotid bruit, no JVD, supple, symmetrical, trachea midline, and thyroid not enlarged, symmetric, no tenderness/mass/nodules Back: symmetric, no curvature. ROM normal. No CVA tenderness. Lungs: clear to auscultation bilaterally Heart: regular rate and rhythm, S1, S2  normal, no murmur, click, rub or gallop Abdomen: soft, non-tender; bowel sounds normal; no masses,  no organomegaly Extremities: extremities normal, atraumatic, no cyanosis or edema Pulses: 2+ and symmetric Skin: Skin color, texture, turgor normal. No rashes or lesions Lymph nodes: Cervical, supraclavicular, and axillary nodes normal. Neurologic: Alert and oriented X 3, normal strength and tone. Normal symmetric reflexes. Normal coordination and gait    Assessment:    Healthy female exam.      Plan:    Ghm utd Check labs  See After Visit Summary for Counseling Recommendations   1. Intractable chronic migraine without aura and without status migrainosus Stable  - topiramate (TOPAMAX) 50 MG tablet; Take 1 tablet (50 mg total) by mouth 2 (two) times daily.  Dispense: 180 tablet; Refill: 3  2. Vitamin D deficiency   - VITAMIN D 25 Hydroxy (Vit-D Deficiency, Fractures)  3. Class 2 severe obesity with serious comorbidity and body mass index (BMI) of 39.0 to 39.9 in adult, unspecified obesity type (HCC)   - CBC with Differential/Platelet - Comprehensive metabolic panel - Lipid panel - VITAMIN D 25 Hydroxy (Vit-D Deficiency, Fractures) - TSH - Vitamin B12 - Insulin, random  4. Other hyperlipidemia Encourage heart healthy diet such as MIND or DASH diet, increase exercise, avoid trans fats, simple carbohydrates and processed foods, consider a krill or fish or flaxseed oil cap daily.    5. Preventative health care See above  - CBC with Differential/Platelet - Comprehensive metabolic panel - Lipid panel - VITAMIN D 25 Hydroxy (Vit-D Deficiency, Fractures) - TSH - Vitamin B12 - Insulin, random

## 2022-02-09 NOTE — Assessment & Plan Note (Signed)
Cont topamax and prn maxalt

## 2022-02-09 NOTE — Patient Instructions (Signed)
Preventive Care 40-56 Years Old, Female Preventive care refers to lifestyle choices and visits with your health care provider that can promote health and wellness. Preventive care visits are also called wellness exams. What can I expect for my preventive care visit? Counseling Your health care provider may ask you questions about your: Medical history, including: Past medical problems. Family medical history. Pregnancy history. Current health, including: Menstrual cycle. Method of birth control. Emotional well-being. Home life and relationship well-being. Sexual activity and sexual health. Lifestyle, including: Alcohol, nicotine or tobacco, and drug use. Access to firearms. Diet, exercise, and sleep habits. Work and work environment. Sunscreen use. Safety issues such as seatbelt and bike helmet use. Physical exam Your health care provider will check your: Height and weight. These may be used to calculate your BMI (body mass index). BMI is a measurement that tells if you are at a healthy weight. Waist circumference. This measures the distance around your waistline. This measurement also tells if you are at a healthy weight and may help predict your risk of certain diseases, such as type 2 diabetes and high blood pressure. Heart rate and blood pressure. Body temperature. Skin for abnormal spots. What immunizations do I need?  Vaccines are usually given at various ages, according to a schedule. Your health care provider will recommend vaccines for you based on your age, medical history, and lifestyle or other factors, such as travel or where you work. What tests do I need? Screening Your health care provider may recommend screening tests for certain conditions. This may include: Lipid and cholesterol levels. Diabetes screening. This is done by checking your blood sugar (glucose) after you have not eaten for a while (fasting). Pelvic exam and Pap test. Hepatitis B test. Hepatitis C  test. HIV (human immunodeficiency virus) test. STI (sexually transmitted infection) testing, if you are at risk. Lung cancer screening. Colorectal cancer screening. Mammogram. Talk with your health care provider about when you should start having regular mammograms. This may depend on whether you have a family history of breast cancer. BRCA-related cancer screening. This may be done if you have a family history of breast, ovarian, tubal, or peritoneal cancers. Bone density scan. This is done to screen for osteoporosis. Talk with your health care provider about your test results, treatment options, and if necessary, the need for more tests. Follow these instructions at home: Eating and drinking  Eat a diet that includes fresh fruits and vegetables, whole grains, lean protein, and low-fat dairy products. Take vitamin and mineral supplements as recommended by your health care provider. Do not drink alcohol if: Your health care provider tells you not to drink. You are pregnant, may be pregnant, or are planning to become pregnant. If you drink alcohol: Limit how much you have to 0-1 drink a day. Know how much alcohol is in your drink. In the [place] one drink equals one 12 oz bottle of beer (355 mL), one 5 oz glass of wine (148 mL), or one 1 oz glass of hard liquor (44 mL). Lifestyle Brush your teeth every morning and night with fluoride toothpaste. Floss one time each day. Exercise for at least 30 minutes 5 or more days each week. Do not use any products that contain nicotine or tobacco. These products include cigarettes, chewing tobacco, and vaping devices, such as e-cigarettes. If you need help quitting, ask your health care provider. Do not use drugs. If you are sexually active, practice safe sex. Use a condom or other form of protection to   prevent STIs. If you do not wish to become pregnant, use a form of birth control. If you plan to become pregnant, see your health care provider for a  prepregnancy visit. Take aspirin only as told by your health care provider. Make sure that you understand how much to take and what form to take. Work with your health care provider to find out whether it is safe and beneficial for you to take aspirin daily. Find healthy ways to manage stress, such as: Meditation, yoga, or listening to music. Journaling. Talking to a trusted person. Spending time with friends and family. Minimize exposure to UV radiation to reduce your risk of skin cancer. Safety Always wear your seat belt while driving or riding in a vehicle. Do not drive: If you have been drinking alcohol. Do not ride with someone who has been drinking. When you are tired or distracted. While texting. If you have been using any mind-altering substances or drugs. Wear a helmet and other protective equipment during sports activities. If you have firearms in your house, make sure you follow all gun safety procedures. Seek help if you have been physically or sexually abused. What's next? Visit your health care provider once a year for an annual wellness visit. Ask your health care provider how often you should have your eyes and teeth checked. Stay up to date on all vaccines. This information is not intended to replace advice given to you by your health care provider. Make sure you discuss any questions you have with your health care provider. Document Revised: 12/25/2020 Document Reviewed: 12/25/2020 Elsevier Patient Education  [place].

## 2022-02-10 ENCOUNTER — Other Ambulatory Visit: Payer: Self-pay | Admitting: Obstetrics and Gynecology

## 2022-02-10 DIAGNOSIS — Z8249 Family history of ischemic heart disease and other diseases of the circulatory system: Secondary | ICD-10-CM

## 2022-02-10 LAB — CBC WITH DIFFERENTIAL/PLATELET
Basophils Absolute: 0.1 10*3/uL (ref 0.0–0.1)
Basophils Relative: 0.9 % (ref 0.0–3.0)
Eosinophils Absolute: 0.2 10*3/uL (ref 0.0–0.7)
Eosinophils Relative: 3.1 % (ref 0.0–5.0)
HCT: 41.7 % (ref 36.0–46.0)
Hemoglobin: 13.9 g/dL (ref 12.0–15.0)
Lymphocytes Relative: 27.2 % (ref 12.0–46.0)
Lymphs Abs: 1.6 10*3/uL (ref 0.7–4.0)
MCHC: 33.4 g/dL (ref 30.0–36.0)
MCV: 88.5 fl (ref 78.0–100.0)
Monocytes Absolute: 0.3 10*3/uL (ref 0.1–1.0)
Monocytes Relative: 5.7 % (ref 3.0–12.0)
Neutro Abs: 3.8 10*3/uL (ref 1.4–7.7)
Neutrophils Relative %: 63.1 % (ref 43.0–77.0)
Platelets: 277 10*3/uL (ref 150.0–400.0)
RBC: 4.72 Mil/uL (ref 3.87–5.11)
RDW: 13.6 % (ref 11.5–15.5)
WBC: 6.1 10*3/uL (ref 4.0–10.5)

## 2022-02-10 LAB — COMPREHENSIVE METABOLIC PANEL
ALT: 14 U/L (ref 0–35)
AST: 19 U/L (ref 0–37)
Albumin: 4.1 g/dL (ref 3.5–5.2)
Alkaline Phosphatase: 53 U/L (ref 39–117)
BUN: 20 mg/dL (ref 6–23)
CO2: 27 mEq/L (ref 19–32)
Calcium: 9.1 mg/dL (ref 8.4–10.5)
Chloride: 101 mEq/L (ref 96–112)
Creatinine, Ser: 0.71 mg/dL (ref 0.40–1.20)
GFR: 95.25 mL/min (ref >60.00)
Glucose, Bld: 86 mg/dL (ref 70–99)
Potassium: 4 mEq/L (ref 3.5–5.1)
Sodium: 137 mEq/L (ref 135–145)
Total Bilirubin: 0.4 mg/dL (ref 0.2–1.2)
Total Protein: 6.2 g/dL (ref 6.0–8.3)

## 2022-02-10 LAB — LIPID PANEL
Cholesterol: 205 mg/dL — ABNORMAL HIGH (ref 0–200)
HDL: 69.5 mg/dL (ref >39.00)
LDL Cholesterol: 124 mg/dL — ABNORMAL HIGH (ref 0–99)
NonHDL: 135.33
Total CHOL/HDL Ratio: 3
Triglycerides: 58 mg/dL (ref 0.0–149.0)
VLDL: 11.6 mg/dL (ref 0.0–40.0)

## 2022-02-10 LAB — VITAMIN B12: Vitamin B-12: 401 pg/mL (ref 211–911)

## 2022-02-10 LAB — TSH: TSH: 1.24 u[IU]/mL (ref 0.35–5.50)

## 2022-02-10 LAB — INSULIN, RANDOM: Insulin: 3 u[IU]/mL

## 2022-02-10 LAB — VITAMIN D 25 HYDROXY (VIT D DEFICIENCY, FRACTURES): VITD: 27.99 ng/mL — ABNORMAL LOW (ref 30.00–100.00)

## 2022-02-18 ENCOUNTER — Other Ambulatory Visit: Payer: Self-pay

## 2022-02-18 ENCOUNTER — Encounter (INDEPENDENT_AMBULATORY_CARE_PROVIDER_SITE_OTHER): Payer: Self-pay

## 2022-02-18 DIAGNOSIS — E559 Vitamin D deficiency, unspecified: Secondary | ICD-10-CM

## 2022-02-18 MED ORDER — VITAMIN D (ERGOCALCIFEROL) 1.25 MG (50000 UNIT) PO CAPS: ORAL_CAPSULE | ORAL | 1 refills | Status: DC

## 2022-03-05 ENCOUNTER — Ambulatory Visit (INDEPENDENT_AMBULATORY_CARE_PROVIDER_SITE_OTHER): Payer: 59 | Admitting: Family Medicine

## 2022-03-18 ENCOUNTER — Other Ambulatory Visit: Payer: Self-pay | Admitting: Family Medicine

## 2022-03-18 DIAGNOSIS — G43719 Chronic migraine without aura, intractable, without status migrainosus: Secondary | ICD-10-CM

## 2022-03-19 MED ORDER — RIZATRIPTAN BENZOATE 10 MG PO TABS: ORAL_TABLET | ORAL | 0 refills | Status: DC

## 2022-03-23 ENCOUNTER — Encounter (INDEPENDENT_AMBULATORY_CARE_PROVIDER_SITE_OTHER): Payer: Self-pay | Admitting: Family Medicine

## 2022-03-23 ENCOUNTER — Ambulatory Visit (INDEPENDENT_AMBULATORY_CARE_PROVIDER_SITE_OTHER): Payer: 59 | Admitting: Family Medicine

## 2022-03-23 VITALS — BP 125/78 | HR 77 | Temp 97.9°F | Ht 65.0 in | Wt 202.0 lb

## 2022-03-23 DIAGNOSIS — R69 Illness, unspecified: Secondary | ICD-10-CM | POA: Diagnosis not present

## 2022-03-23 DIAGNOSIS — Z9189 Other specified personal risk factors, not elsewhere classified: Secondary | ICD-10-CM

## 2022-03-23 DIAGNOSIS — R632 Polyphagia: Secondary | ICD-10-CM | POA: Diagnosis not present

## 2022-03-23 DIAGNOSIS — E559 Vitamin D deficiency, unspecified: Secondary | ICD-10-CM | POA: Diagnosis not present

## 2022-03-23 DIAGNOSIS — E669 Obesity, unspecified: Secondary | ICD-10-CM

## 2022-03-23 DIAGNOSIS — F4329 Adjustment disorder with other symptoms: Secondary | ICD-10-CM | POA: Diagnosis not present

## 2022-03-23 DIAGNOSIS — Z6833 Body mass index (BMI) 33.0-33.9, adult: Secondary | ICD-10-CM

## 2022-03-23 MED ORDER — VITAMIN D (ERGOCALCIFEROL) 1.25 MG (50000 UNIT) PO CAPS: ORAL_CAPSULE | ORAL | 0 refills | Status: DC

## 2022-03-28 NOTE — Progress Notes (Signed)
Chief Complaint:   OBESITY Kara King is here to discuss her progress with her obesity treatment plan along with follow-up of her obesity related diagnoses. Kara King is on the Category 1 Plan and states she is following her eating plan approximately 50% of the time. Kara King states she is doing boot camp 45 minutes 4 times per week.  Today's visit was #: 75 Starting weight: 240 lbs Starting date: 08/08/2020 Today's weight: 202 lbs Today's date: 03/23/2022 Total lbs lost to date: 38 Total lbs lost since last in-office visit: +9  Interim History: "I wasn't on the plan and didn't even try."   Pt is struggling emotionally and has more stress lately. She is having migraines and ran out of meds and states she suffered badly a couple of days. Pt states she is going to a counselor every 2 weeks to help with stressors and is ready to recommit to the meal plan.  Subjective:   1. 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 She is thinking about job change and has been more stress eating lately.  2. Polyphagia Pt is on Topamax 50 mg daily and phentermine 37.5 mg (or only on 1/2 tab phentermine daily).   3. Vitamin D deficiency She is currently taking prescription vitamin D 50,000 IU each week. She denies nausea, vomiting or muscle weakness. Pt had labs with PCP on 02/09/2022, and asked the I review them with her. Vit D level 27.99.  4. At risk for impaired metabolic function Kara King is at risk for impaired metabolic function due to impaired fasting glucose.  Assessment/Plan:  No orders of the defined types were placed in this encounter.   Medications Discontinued During This Encounter  Medication Reason   phentermine (ADIPEX-P) 37.5 MG tablet    Vitamin D, Ergocalciferol, (DRISDOL) 1.25 MG (50000 UNIT) CAPS capsule Reorder     Meds ordered this encounter  Medications   Vitamin D, Ergocalciferol, (DRISDOL) 1.25 MG  (50000 UNIT) CAPS capsule    Sig: 1 capsule by mouth every 7 days.    Dispense:  4 capsule    Refill:  0     1. Stress and adjustment reaction Pt declines mood meds and will continue with every 2 week counseling sessions. She declines referral to Dr. Mallie Mussel. Discussed how thoughts affect eating habits, modeling of thoughts, feelings, and behaviors, and strategies for change.  Importance of not skipping meals and getting all her proteins and fiber in on a daily basis discussed.   - Discussed cognitive distortions, coping thoughts, and how to change our thoughts/ self talk regarding foods/ eating patterns. - No SI/ HI.  Mood stable currently - Behavior modification techniques were discussed today to help deal with emotional/ non-hunger eating behaviors including but not limited to exercise for stress management, meditation/prayer, behavorial sessions with her therapist and self care activities like adequate sleep (7-9 hrs/nite). - Importance of following up with PCP and others was stressed  - Reminded patient of the importance of following their prudent nutrition plan and how food can affect mood as well to support emotional wellbeing.  - We will continue to monitor closely alongside PCP / other specialists.   2. Polyphagia Intensive lifestyle modifications are the first line treatment for this issue. We discussed several lifestyle modifications today and she will continue to work on diet, exercise and weight loss efforts. Orders and follow up as documented in patient record. Take Topamax twice a day, regularly as  written to help with cravings. Continue prudent nutritional plan.  Counseling Polyphagia is excessive hunger. Causes can include: low blood sugars, hypERthyroidism, PMS, lack of sleep, stress, insulin resistance, diabetes, certain medications, and diets that are deficient in protein and fiber.   3. Vitamin D deficiency - I again reiterated the importance of vitamin D (as well as  calcium) to their health and wellbeing.  - I reviewed possible symptoms of low Vitamin D:  low energy, depressed mood, muscle aches, joint aches, osteoporosis etc. - low Vitamin D levels may be linked to an increased risk of cardiovascular events and even increased risk of cancers- such as colon and breast.  - ideal vitamin D levels reviewed with patient  - I recommend pt take a 50,000 IU weekly prescription vit D - see script below   - Informed patient this may be a lifelong thing, and she was encouraged to continue to take the medicine until told otherwise.    - weight loss will likely improve availability of vitamin D, thus encouraged Kara King to continue with meal plan and their weight loss efforts to further improve this condition.  Thus, we will need to monitor levels regularly (every 3-4 mo on average) to keep levels within normal limits and prevent over supplementation. - pt's questions and concerns regarding this condition addressed.  Refill & Restart (pt prefers to get refills from [facility])- Vitamin D, Ergocalciferol, (DRISDOL) 1.25 MG (50000 UNIT) CAPS capsule; 1 capsule by mouth every 7 days.  Dispense: 4 capsule; Refill: 0  4. At risk for impaired metabolic function Due to Kara King's current state of health and medical condition(s), she is at a significantly higher risk for impaired metabolic function.   At least 9 minutes was spent on counseling Kara King about these concerns today.  This places the patient at a much greater risk to subsequently develop cardio-pulmonary conditions that can negatively affect the patient's quality of life.  I stressed the importance of reversing these risks factors.  The initial goal is to lose at least 5-10% of starting weight to help reduce risk factors.  Counseling:  Intensive lifestyle modifications discussed with Kara King as the most appropriate first line treatment.  she will continue to work on diet, exercise, and weight loss efforts.  We will continue  to reassess these conditions on a fairly regular basis in an attempt to decrease the patient's overall morbidity and mortality.  5. Obesity, current BMI 33.6 Kara King is currently in the action stage of change. As such, her goal is to continue with weight loss efforts. She has agreed to change to keeping a food journal and adhering to recommended goals of 1000 calories and 80 grams protein.   Bring journaling log to next OV.  Exercise goals:  As is  Behavioral modification strategies: meal planning and cooking strategies and avoiding temptations.  Kara King has agreed to follow-up with our clinic in 3 weeks. She was informed of the importance of frequent follow-up visits to maximize her success with intensive lifestyle modifications for her multiple health conditions.   Objective:   Blood pressure 125/78, pulse 77, temperature 97.9 F (36.6 C), height '5\' 5"'$  (1.651 m), weight 202 lb (91.6 kg), last menstrual period 09/30/2016, SpO2 96 %. Body mass index is 33.61 kg/m.  General: Cooperative, alert, well developed, in no acute distress. HEENT: Conjunctivae and lids unremarkable. Cardiovascular: Regular rhythm.  Lungs: Normal work of breathing. Neurologic: No focal deficits.   Lab Results  Component Value Date   CREATININE 0.71 02/09/2022  BUN 20 02/09/2022   NA 137 02/09/2022   K 4.0 02/09/2022   CL 101 02/09/2022   CO2 27 02/09/2022   Lab Results  Component Value Date   ALT 14 02/09/2022   AST 19 02/09/2022   ALKPHOS 53 02/09/2022   BILITOT 0.4 02/09/2022   Lab Results  Component Value Date   HGBA1C 5.5 07/03/2021   HGBA1C 5.6 08/08/2020   Lab Results  Component Value Date   INSULIN 2.2 (L) 07/03/2021   INSULIN 4.5 08/08/2020   Lab Results  Component Value Date   TSH 1.24 02/09/2022   Lab Results  Component Value Date   CHOL 205 (H) 02/09/2022   HDL 69.50 02/09/2022   LDLCALC 124 (H) 02/09/2022   TRIG 58.0 02/09/2022   CHOLHDL 3 02/09/2022   Lab Results   Component Value Date   VD25OH 27.99 (L) 02/09/2022   VD25OH 71.9 07/03/2021   VD25OH 55.6 12/18/2020   Lab Results  Component Value Date   WBC 6.1 02/09/2022   HGB 13.9 02/09/2022   HCT 41.7 02/09/2022   MCV 88.5 02/09/2022   PLT 277.0 02/09/2022    Attestation Statements:   Reviewed by clinician on day of visit: allergies, medications, problem list, medical history, surgical history, family history, social history, and previous encounter notes.  I, Kathlene November, BS, CMA, am acting as transcriptionist for [employer], DO.   I have reviewed the above documentation for accuracy and completeness, and I agree with the above. Marjory Sneddon, D.O.  The [place] was signed into law in 2016 which includes the topic of electronic health records.  This provides immediate access to information in MyChart.  This includes consultation notes, operative notes, office notes, lab results and pathology reports.  If you have any questions about what you read please let us know at your next visit so we can discuss your concerns and take corrective action if need be.  We are right here with you.

## 2022-04-06 ENCOUNTER — Encounter: Payer: Self-pay | Admitting: Family Medicine

## 2022-04-16 ENCOUNTER — Ambulatory Visit (INDEPENDENT_AMBULATORY_CARE_PROVIDER_SITE_OTHER): Payer: 59 | Admitting: Family Medicine

## 2022-04-16 ENCOUNTER — Encounter (INDEPENDENT_AMBULATORY_CARE_PROVIDER_SITE_OTHER): Payer: Self-pay | Admitting: Family Medicine

## 2022-04-16 VITALS — BP 132/82 | HR 60 | Temp 98.4°F | Ht 65.0 in | Wt 205.0 lb

## 2022-04-16 DIAGNOSIS — E669 Obesity, unspecified: Secondary | ICD-10-CM | POA: Diagnosis not present

## 2022-04-16 DIAGNOSIS — Z6834 Body mass index (BMI) 34.0-34.9, adult: Secondary | ICD-10-CM

## 2022-04-16 DIAGNOSIS — E559 Vitamin D deficiency, unspecified: Secondary | ICD-10-CM

## 2022-04-16 DIAGNOSIS — R632 Polyphagia: Secondary | ICD-10-CM

## 2022-04-16 MED ORDER — BUPROPION HCL ER (SR) 100 MG PO TB12: ORAL_TABLET | ORAL | 0 refills | Status: DC

## 2022-04-16 MED ORDER — VITAMIN D (ERGOCALCIFEROL) 1.25 MG (50000 UNIT) PO CAPS: ORAL_CAPSULE | ORAL | 0 refills | Status: DC

## 2022-04-21 DIAGNOSIS — Z13820 Encounter for screening for osteoporosis: Secondary | ICD-10-CM | POA: Diagnosis not present

## 2022-04-26 NOTE — Progress Notes (Signed)
Chief Complaint:   OBESITY Kara King is here to discuss her progress with her obesity treatment plan along with follow-up of her obesity related diagnoses. Kara King is on keeping a food journal and adhering to recommended goals of 1000 calories and 80 grams protein and states she is following her eating plan approximately 50% of the time. Kara King states she is doing boot camp 45 minutes 4 times per week.  Today's visit was #: 28 Starting weight: 240 lbs Starting date: 08/08/2020 Today's weight: 205 lbs Today's date: 04/16/2022 Total lbs lost to date: 35 Total lbs lost since last in-office visit: +3  Interim History: Kara King has done much more stress eating than usual and more snacking at work. She denies issues with meal plan and know what to do.  Subjective:   1. Polyphagia We stopped phentermine at last OV. Pt is experiencing the same level/ similar level of hunger and cravings. She is doing a lot of emotional eating due to stress.  2. Vitamin D deficiency Kara King's Vit D level 2 months ago was 27.99. She was taking supplement every 10 days regularly prior to lab, therefore we increased to every 7 days approximately 1 month ago.  Assessment/Plan:  No orders of the defined types were placed in this encounter.   Medications Discontinued During This Encounter  Medication Reason   Vitamin D, Ergocalciferol, (DRISDOL) 1.25 MG (50000 UNIT) CAPS capsule Reorder     Meds ordered this encounter  Medications   Vitamin D, Ergocalciferol, (DRISDOL) 1.25 MG (50000 UNIT) CAPS capsule    Sig: 1 capsule by mouth every 7 days.    Dispense:  4 capsule    Refill:  0   buPROPion ER (WELLBUTRIN SR) 100 MG 12 hr tablet    Sig: 1 po q am    Dispense:  30 tablet    Refill:  0     1. Polyphagia Intensive lifestyle modifications are the first line treatment for this issue. We discussed several lifestyle modifications today and she will continue to work on diet, exercise and weight  loss efforts. Orders and follow up as documented in patient record.  Start low dose Wellbutrin SR 50 mg every morning for 6 days, then 100 mg every morning, increase fiber and protein.  Counseling Polyphagia is excessive hunger. Causes can include: low blood sugars, hypERthyroidism, PMS, lack of sleep, stress, insulin resistance, diabetes, certain medications, and diets that are deficient in protein and fiber.   Start- buPROPion ER (WELLBUTRIN SR) 100 MG 12 hr tablet; 1 po q am  Dispense: 30 tablet; Refill: 0  2. Vitamin D deficiency - I again reiterated the importance of vitamin D (as well as calcium) to their health and wellbeing.  - I reviewed possible symptoms of low Vitamin D:  low energy, depressed mood, muscle aches, joint aches, osteoporosis etc. - low Vitamin D levels may be linked to an increased risk of cardiovascular events and even increased risk of cancers- such as colon and breast.  - ideal vitamin D levels reviewed with patient  - I recommend pt take a 50,000 IU weekly prescription vit D - see script below   - Informed patient this may be a lifelong thing, and she was encouraged to continue to take the medicine until told otherwise.    - weight loss will likely improve availability of vitamin D, thus encouraged Kara King to continue with meal plan and their weight loss efforts to further improve this condition.  Thus, we will need to  monitor levels regularly (every 3-4 mo on average) to keep levels within normal limits and prevent over supplementation. - pt's questions and concerns regarding this condition addressed.  Refill- Vitamin D, Ergocalciferol, (DRISDOL) 1.25 MG (50000 UNIT) CAPS capsule; 1 capsule by mouth every 7 days.  Dispense: 4 capsule; Refill: 0  3. Obesity, current BMI 34.2 Kara King is currently in the action stage of change. As such, her goal is to continue with weight loss efforts. She has agreed to keeping a food journal and adhering to recommended goals of  1000 calories and 80 protein.   Exercise goals:  As is  Behavioral modification strategies: increasing lean protein intake, decreasing simple carbohydrates, and planning for success.  Kara King has agreed to follow-up with our clinic in 3 weeks. She was informed of the importance of frequent follow-up visits to maximize her success with intensive lifestyle modifications for her multiple health conditions.   Objective:   Blood pressure 132/82, pulse 60, temperature 98.4 F (36.9 C), height 5\' 5"  (1.651 m), weight 205 lb (93 kg), last menstrual period 09/30/2016, SpO2 99 %. Body mass index is 34.11 kg/m.  General: Cooperative, alert, well developed, in no acute distress. HEENT: Conjunctivae and lids unremarkable. Cardiovascular: Regular rhythm.  Lungs: Normal work of breathing. Neurologic: No focal deficits.   Lab Results  Component Value Date   CREATININE 0.71 02/09/2022   BUN 20 02/09/2022   NA 137 02/09/2022   K 4.0 02/09/2022   CL 101 02/09/2022   CO2 27 02/09/2022   Lab Results  Component Value Date   ALT 14 02/09/2022   AST 19 02/09/2022   ALKPHOS 53 02/09/2022   BILITOT 0.4 02/09/2022   Lab Results  Component Value Date   HGBA1C 5.5 07/03/2021   HGBA1C 5.6 08/08/2020   Lab Results  Component Value Date   INSULIN 2.2 (L) 07/03/2021   INSULIN 4.5 08/08/2020   Lab Results  Component Value Date   TSH 1.24 02/09/2022   Lab Results  Component Value Date   CHOL 205 (H) 02/09/2022   HDL 69.50 02/09/2022   LDLCALC 124 (H) 02/09/2022   TRIG 58.0 02/09/2022   CHOLHDL 3 02/09/2022   Lab Results  Component Value Date   VD25OH 27.99 (L) 02/09/2022   VD25OH 71.9 07/03/2021   VD25OH 55.6 12/18/2020   Lab Results  Component Value Date   WBC 6.1 02/09/2022   HGB 13.9 02/09/2022   HCT 41.7 02/09/2022   MCV 88.5 02/09/2022   PLT 277.0 02/09/2022    Attestation Statements:   Reviewed by clinician on day of visit: allergies, medications, problem list,  medical history, surgical history, family history, social history, and previous encounter notes.  I, Kara King, BS, CMA, am acting as transcriptionist for [employer], DO.   I have reviewed the above documentation for accuracy and completeness, and I agree with the above. Carlye Grippe, D.O.  The 21st Century Cures Act was signed into law in 2016 which includes the topic of electronic health records.  This provides immediate access to information in MyChart.  This includes consultation notes, operative notes, office notes, lab results and pathology reports.  If you have any questions about what you read please let us know at your next visit so we can discuss your concerns and take corrective action if need be.  We are right here with you.

## 2022-04-29 ENCOUNTER — Ambulatory Visit
Admission: RE | Admit: 2022-04-29 | Discharge: 2022-04-29 | Disposition: A | Discharge status: Home / Self Care | Payer: No Typology Code available for payment source | Source: Ambulatory Visit | Attending: Obstetrics and Gynecology | Admitting: Obstetrics and Gynecology

## 2022-04-29 DIAGNOSIS — Z8249 Family history of ischemic heart disease and other diseases of the circulatory system: Secondary | ICD-10-CM

## 2022-05-04 ENCOUNTER — Other Ambulatory Visit: Payer: Self-pay | Admitting: Family Medicine

## 2022-05-04 DIAGNOSIS — G43719 Chronic migraine without aura, intractable, without status migrainosus: Secondary | ICD-10-CM

## 2022-05-06 ENCOUNTER — Encounter (INDEPENDENT_AMBULATORY_CARE_PROVIDER_SITE_OTHER): Payer: Self-pay | Admitting: Family Medicine

## 2022-05-06 ENCOUNTER — Ambulatory Visit (INDEPENDENT_AMBULATORY_CARE_PROVIDER_SITE_OTHER): Payer: 59 | Admitting: Family Medicine

## 2022-05-06 VITALS — BP 130/82 | HR 60 | Temp 99.6°F | Ht 65.0 in | Wt 205.0 lb

## 2022-05-06 DIAGNOSIS — R7301 Impaired fasting glucose: Secondary | ICD-10-CM

## 2022-05-06 DIAGNOSIS — Z6834 Body mass index (BMI) 34.0-34.9, adult: Secondary | ICD-10-CM | POA: Diagnosis not present

## 2022-05-06 DIAGNOSIS — E559 Vitamin D deficiency, unspecified: Secondary | ICD-10-CM

## 2022-05-06 DIAGNOSIS — E669 Obesity, unspecified: Secondary | ICD-10-CM

## 2022-05-06 MED ORDER — VITAMIN D (ERGOCALCIFEROL) 1.25 MG (50000 UNIT) PO CAPS: ORAL_CAPSULE | ORAL | 0 refills | Status: DC

## 2022-05-06 MED ORDER — SEMAGLUTIDE-WEIGHT MANAGEMENT 0.25 MG/0.5ML ~~LOC~~ SOAJ: 0.2500 mg | SUBCUTANEOUS | 0 refills | Status: AC

## 2022-05-06 NOTE — Patient Instructions (Signed)
The 10-year ASCVD risk score (Arnett DK, et al., 2019) is: 1.9%   Values used to calculate the score:     Age: 56 years     Sex: Female     Is Non-Hispanic African American: No     Diabetic: No     Tobacco smoker: No     Systolic Blood Pressure: 409 mmHg     Is BP treated: No     HDL Cholesterol: 69.5 mg/dL     Total Cholesterol: 205 mg/dL

## 2022-05-11 ENCOUNTER — Telehealth (INDEPENDENT_AMBULATORY_CARE_PROVIDER_SITE_OTHER): Payer: Self-pay | Admitting: Family Medicine

## 2022-05-11 ENCOUNTER — Encounter (INDEPENDENT_AMBULATORY_CARE_PROVIDER_SITE_OTHER): Payer: Self-pay

## 2022-05-11 NOTE — Telephone Encounter (Signed)
Dr. Raliegh Scarlet - Prior authorization denied for [facility]. Per insurance: Not a covered benefit/plan exclusion. Patient sent denial message.

## 2022-05-18 ENCOUNTER — Encounter (INDEPENDENT_AMBULATORY_CARE_PROVIDER_SITE_OTHER): Payer: Self-pay | Admitting: Family Medicine

## 2022-05-19 NOTE — Progress Notes (Signed)
Chief Complaint:   OBESITY Kara King is here to discuss her progress with her obesity treatment plan along with follow-up of her obesity related diagnoses. Kara King is on keeping a food journal and adhering to recommended goals of 1000 calories and 80 grams of protein and states she is following her eating plan approximately 0% of the time. Kara King states she is not currently exercising.  Today's visit was #: 36 Starting weight: 240 lbs Starting date: 08/08/2020 Today's weight: 205 lbs Today's date: 05/06/2022 Total lbs lost to date: 35 Total lbs lost since last in-office visit: 0  Interim History: Patient is feeling like she is under a lot of stress and she needs to take a break.  She is focused on her self-care and she is going to see her therapist.  Subjective:   1. Impaired fasting glucose with polyphagia Patient was started on Wellbutrin at her last office visit and she did not tolerate it well, and she felt anxious and like she had to focus to breathe.  2. Vitamin D deficiency She is currently taking prescription vitamin D 50,000 IU each week. She denies nausea, vomiting or muscle weakness.  Assessment/Plan:  No orders of the defined types were placed in this encounter.   Medications Discontinued During This Encounter  Medication Reason   buPROPion ER (WELLBUTRIN SR) 100 MG 12 hr tablet Side effect (s)   Vitamin D, Ergocalciferol, (DRISDOL) 1.25 MG (50000 UNIT) CAPS capsule Reorder     Meds ordered this encounter  Medications   Vitamin D, Ergocalciferol, (DRISDOL) 1.25 MG (50000 UNIT) CAPS capsule    Sig: 1 capsule by mouth every 7 days.    Dispense:  4 capsule    Refill:  0   Semaglutide-Weight Management 0.25 MG/0.5ML SOAJ    Sig: Inject 0.25 mg into the skin once a week.    Dispense:  2 mL    Refill:  0     1. Impaired fasting glucose with polyphagia Patient is to discontinue Wellbutrin, and start Wegovy 0.25 mg once weekly.  We will follow-up at her  next office visit.  - Semaglutide-Weight Management 0.25 MG/0.5ML SOAJ; Inject 0.25 mg into the skin once a week.  Dispense: 2 mL; Refill: 0  2. Vitamin D deficiency We will refill prescription Vitamin D for 1 month.  She will need her vitamin D level rechecked in the near future.  - Vitamin D, Ergocalciferol, (DRISDOL) 1.25 MG (50000 UNIT) CAPS capsule; 1 capsule by mouth every 7 days.  Dispense: 4 capsule; Refill: 0  3. Obesity, current BMI 34.1 Kara King is currently in the action stage of change. As such, her goal is to continue with weight loss efforts. She has agreed to keeping a food journal and adhering to recommended goals of 1000 calories and 80 grams of protein.   Exercise goals: As is.   Behavioral modification strategies: increasing lean protein intake, decreasing simple carbohydrates, and planning for success.  Kara King has agreed to follow-up with our clinic in 3 to 4 weeks. She was informed of the importance of frequent follow-up visits to maximize her success with intensive lifestyle modifications for her multiple health conditions.   Objective:   Blood pressure 130/82, pulse 60, temperature 99.6 F (37.6 C), height '5\' 5"'$  (1.651 m), weight 205 lb (93 kg), last menstrual period 09/30/2016, SpO2 99 %. Body mass index is 34.11 kg/m.  General: Cooperative, alert, well developed, in no acute distress. HEENT: Conjunctivae and lids unremarkable. Cardiovascular: Regular rhythm.  Lungs:  Normal work of breathing. Neurologic: No focal deficits.   Lab Results  Component Value Date   CREATININE 0.71 02/09/2022   BUN 20 02/09/2022   NA 137 02/09/2022   K 4.0 02/09/2022   CL 101 02/09/2022   CO2 27 02/09/2022   Lab Results  Component Value Date   ALT 14 02/09/2022   AST 19 02/09/2022   ALKPHOS 53 02/09/2022   BILITOT 0.4 02/09/2022   Lab Results  Component Value Date   HGBA1C 5.5 07/03/2021   HGBA1C 5.6 08/08/2020   Lab Results  Component Value Date   INSULIN  2.2 (L) 07/03/2021   INSULIN 4.5 08/08/2020   Lab Results  Component Value Date   TSH 1.24 02/09/2022   Lab Results  Component Value Date   CHOL 205 (H) 02/09/2022   HDL 69.50 02/09/2022   LDLCALC 124 (H) 02/09/2022   TRIG 58.0 02/09/2022   CHOLHDL 3 02/09/2022   Lab Results  Component Value Date   VD25OH 27.99 (L) 02/09/2022   VD25OH 71.9 07/03/2021   VD25OH 55.6 12/18/2020   Lab Results  Component Value Date   WBC 6.1 02/09/2022   HGB 13.9 02/09/2022   HCT 41.7 02/09/2022   MCV 88.5 02/09/2022   PLT 277.0 02/09/2022   No results found for: "IRON", "TIBC", "FERRITIN"  Attestation Statements:   Reviewed by clinician on day of visit: allergies, medications, problem list, medical history, surgical history, family history, social history, and previous encounter notes.   Wilhemena Durie, am acting as transcriptionist for [employer], DO.   I have reviewed the above documentation for accuracy and completeness, and I agree with the above. Marjory Sneddon, D.O.  The [place] was signed into law in 2016 which includes the topic of electronic health records.  This provides immediate access to information in MyChart.  This includes consultation notes, operative notes, office notes, lab results and pathology reports.  If you have any questions about what you read please let us know at your next visit so we can discuss your concerns and take corrective action if need be.  We are right here with you.

## 2022-05-28 ENCOUNTER — Ambulatory Visit (INDEPENDENT_AMBULATORY_CARE_PROVIDER_SITE_OTHER): Payer: 59 | Admitting: Family Medicine

## 2022-06-25 ENCOUNTER — Ambulatory Visit (INDEPENDENT_AMBULATORY_CARE_PROVIDER_SITE_OTHER): Payer: 59 | Admitting: Family Medicine

## 2022-06-30 ENCOUNTER — Other Ambulatory Visit: Payer: Self-pay | Admitting: Family Medicine

## 2022-06-30 DIAGNOSIS — G43719 Chronic migraine without aura, intractable, without status migrainosus: Secondary | ICD-10-CM

## 2022-07-21 ENCOUNTER — Encounter (INDEPENDENT_AMBULATORY_CARE_PROVIDER_SITE_OTHER): Payer: Self-pay | Admitting: Family Medicine

## 2022-07-21 ENCOUNTER — Ambulatory Visit (INDEPENDENT_AMBULATORY_CARE_PROVIDER_SITE_OTHER): Payer: 59 | Admitting: Family Medicine

## 2022-07-21 VITALS — BP 131/85 | HR 51 | Temp 97.9°F | Ht 65.0 in | Wt 225.8 lb

## 2022-07-21 DIAGNOSIS — E669 Obesity, unspecified: Secondary | ICD-10-CM | POA: Diagnosis not present

## 2022-07-21 DIAGNOSIS — E559 Vitamin D deficiency, unspecified: Secondary | ICD-10-CM | POA: Diagnosis not present

## 2022-07-21 DIAGNOSIS — Z6837 Body mass index (BMI) 37.0-37.9, adult: Secondary | ICD-10-CM | POA: Diagnosis not present

## 2022-07-21 MED ORDER — VITAMIN D (ERGOCALCIFEROL) 1.25 MG (50000 UNIT) PO CAPS: ORAL_CAPSULE | ORAL | 0 refills | Status: DC

## 2022-08-13 ENCOUNTER — Ambulatory Visit (INDEPENDENT_AMBULATORY_CARE_PROVIDER_SITE_OTHER): Payer: 59 | Admitting: Family Medicine

## 2022-08-13 VITALS — BP 118/68 | HR 61 | Temp 98.4°F | Ht 65.0 in | Wt 227.4 lb

## 2022-08-13 DIAGNOSIS — E559 Vitamin D deficiency, unspecified: Secondary | ICD-10-CM

## 2022-08-13 DIAGNOSIS — F4329 Adjustment disorder with other symptoms: Secondary | ICD-10-CM | POA: Diagnosis not present

## 2022-08-13 DIAGNOSIS — G43719 Chronic migraine without aura, intractable, without status migrainosus: Secondary | ICD-10-CM | POA: Diagnosis not present

## 2022-08-13 DIAGNOSIS — Z6837 Body mass index (BMI) 37.0-37.9, adult: Secondary | ICD-10-CM

## 2022-08-13 MED ORDER — VITAMIN D (ERGOCALCIFEROL) 1.25 MG (50000 UNIT) PO CAPS: ORAL_CAPSULE | ORAL | 0 refills | Status: DC

## 2022-08-13 NOTE — Progress Notes (Signed)
Chief Complaint:   OBESITY Kara King is here to discuss her progress with her obesity treatment plan along with follow-up of her obesity related diagnoses. Kara King is on keeping a food journal and adhering to recommended goals of 1000 calories and 80 grams of protein and states she is following her eating plan approximately 100% of the time. Kara King states she is doing boot camp class for 45 minutes 4 times per week.  Today's visit was #: 30 Starting weight: 240 lbs Starting date: 08/08/2020 Today's weight: 225 lbs Today's date: 07/21/2022 Total lbs lost to date: 15 Total lbs lost since last in-office visit: 0  Interim History: Kara King gained 20 lbs since her last office visit on 05/06/2022. She has ben more consistent with counseling. She states she "broke up" with [employer] 2-3 weeks ago. She has been back on her plan for 1-2 weeks now and she lost weight. She started going to [employer] exercises daily for 2 weeks. Goal is 30 lbs weight loss by May 10th graduation.   Subjective:   1. Vitamin D deficiency Jerriann's last Vitamin D level was 27.99 five months ago. She has been consistent with Ergo for 2-3 weeks now.   Assessment/Plan:  No orders of the defined types were placed in this encounter.   Medications Discontinued During This Encounter  Medication Reason   Vitamin D, Ergocalciferol, (DRISDOL) 1.25 MG (50000 UNIT) CAPS capsule Reorder     Meds ordered this encounter  Medications   DISCONTD: Vitamin D, Ergocalciferol, (DRISDOL) 1.25 MG (50000 UNIT) CAPS capsule    Sig: 1 capsule by mouth every 7 days.    Dispense:  4 capsule    Refill:  0     1. Vitamin D deficiency We will refill Ergo, and we will recheck Vit D level once she is consistent with dosing for 2-3 months (April 2024). Counseling ws done especially with her recent diagnosis of osteopenia on her bone density.   - Vitamin D, Ergocalciferol, (DRISDOL) 1.25 MG (50000 UNIT) CAPS capsule; 1 capsule by  mouth every 7 days.  Dispense: 4 capsule; Refill: 0  2. Obesity, current BMI 37.6 Kara King is currently in the action stage of change. As such, her goal is to continue with weight loss efforts. She has agreed to keeping a food journal and adhering to recommended goals of 1000 calories and 80 grams of protein.   Exercise goals: Strength training at least 2 days per week. Continue current exercise with [employer]. Bring in her log.   Behavioral modification strategies: increasing lean protein intake, avoiding temptations, and keeping a strict food journal.  Kara King has agreed to follow-up with our clinic in 3 to 4 weeks. She was informed of the importance of frequent follow-up visits to maximize her success with intensive lifestyle modifications for her multiple health conditions.   Objective:   Blood pressure 131/85, pulse (!) 51, temperature 97.9 F (36.6 C), height 5' 5"$  (1.651 m), weight 225 lb 12.8 oz (102.4 kg), last menstrual period 09/30/2016, SpO2 100 %. Body mass index is 37.58 kg/m.  General: Cooperative, alert, well developed, in no acute distress. HEENT: Conjunctivae and lids unremarkable. Cardiovascular: Regular rhythm.  Lungs: Normal work of breathing. Neurologic: No focal deficits.   Lab Results  Component Value Date   CREATININE 0.71 02/09/2022   BUN 20 02/09/2022   NA 137 02/09/2022   K 4.0 02/09/2022   CL 101 02/09/2022   CO2 27 02/09/2022   Lab Results  Component Value  Date   ALT 14 02/09/2022   AST 19 02/09/2022   ALKPHOS 53 02/09/2022   BILITOT 0.4 02/09/2022   Lab Results  Component Value Date   HGBA1C 5.5 07/03/2021   HGBA1C 5.6 08/08/2020   Lab Results  Component Value Date   INSULIN 2.2 (L) 07/03/2021   INSULIN 4.5 08/08/2020   Lab Results  Component Value Date   TSH 1.24 02/09/2022   Lab Results  Component Value Date   CHOL 205 (H) 02/09/2022   HDL 69.50 02/09/2022   LDLCALC 124 (H) 02/09/2022   TRIG 58.0 02/09/2022   CHOLHDL 3  02/09/2022   Lab Results  Component Value Date   VD25OH 27.99 (L) 02/09/2022   VD25OH 71.9 07/03/2021   VD25OH 55.6 12/18/2020   Lab Results  Component Value Date   WBC 6.1 02/09/2022   HGB 13.9 02/09/2022   HCT 41.7 02/09/2022   MCV 88.5 02/09/2022   PLT 277.0 02/09/2022   No results found for: "IRON", "TIBC", "FERRITIN"  Attestation Statements:   Reviewed by clinician on day of visit: allergies, medications, problem list, medical history, surgical history, family history, social history, and previous encounter notes.   Wilhemena Durie, am acting as transcriptionist for [employer], DO.  I have reviewed the above documentation for accuracy and completeness, and I agree with the above. Marjory Sneddon, D.O.  The [place] was signed into law in 2016 which includes the topic of electronic health records.  This provides immediate access to information in MyChart.  This includes consultation notes, operative notes, office notes, lab results and pathology reports.  If you have any questions about what you read please let us know at your next visit so we can discuss your concerns and take corrective action if need be.  We are right here with you.

## 2022-09-02 NOTE — Progress Notes (Signed)
Chief Complaint:   OBESITY Kara King is here to discuss her progress with her obesity treatment plan along with follow-up of her obesity related diagnoses. Kara King is on keeping a food journal and adhering to recommended goals of 1000 calories and 80 protein and states she is following her eating plan approximately 85% of the time. Kara King states she is for 45 minutes 5 times per week.  Today's visit was #: 58 Starting weight: 240 LBS Starting date: 08/08/2020 Today's weight: 227 LBS Today's date: 08/13/2022 Total lbs lost to date: 13 LBS Total lbs lost since last in-office visit: +2 LBS  Interim History: Patient states that she is okay, she has made improvements and is doing better.  Work is stressful as a family member has passed away recently.  Patient still does great with her meals and eating on plan the majority of the time.  She exercises 5 days/week.  In the past GLP-1's were not covered, phentermine no weight loss medications.  Patient has been unable to tolerate Wellbutrin etc.  Subjective:   1. Stress and adjustment reaction Patient is a single mom and has a 28 year old son with her.  Patient is unable to tolerate Wellbutrin or Effexor in the past with PCP.  Patient denies need for medication at this time.  She sees a Social worker on a regular basis.  2. Chronic migraine-  without aura and without status migrainosus Patient is on Topamax 50 mg twice daily and has been since 2019.  It possibly helps with cravings etc.  3. Vitamin D deficiency Vitamin D level 27.99 six months ago and not at goal.  Also, B12 was 409, six months ago.  Assessment/Plan:   Medications Discontinued During This Encounter  Medication Reason   Vitamin D, Ergocalciferol, (DRISDOL) 1.25 MG (50000 UNIT) CAPS capsule Reorder     Meds ordered this encounter  Medications   Vitamin D, Ergocalciferol, (DRISDOL) 1.25 MG (50000 UNIT) CAPS capsule    Sig: 1 capsule by mouth every 7 days.    Dispense:   4 capsule    Refill:  0      1. Stress and adjustment reaction Continue mindful eating, stress management strategies and exercise daily.  Continue counseling.  2. Chronic migraine-  without aura and without status migrainosus Continue Topamax, migraines under good control.  Increase water intake and continue stress management techniques and proper sleep hygiene.  3. Vitamin D deficiency Consider recheck next office visit along with B12. - I again reiterated the importance of vitamin D (as well as calcium) to their health and wellbeing.  - I reviewed possible symptoms of low Vitamin D:  low energy, depressed mood, muscle aches, joint aches, osteoporosis etc. - low Vitamin D levels may be linked to an increased risk of cardiovascular events and even increased risk of cancers- such as colon and breast.  - ideal vitamin D levels reviewed with patient  - I recommend pt take weekly prescription vit D - see script below   - Informed patient this may be a lifelong thing, and she was encouraged to continue to take the medicine until told otherwise.    - weight loss will likely improve availability of vitamin D, thus encouraged Kara King to continue with meal plan and their weight loss efforts to further improve this condition.  Thus, we will need to monitor levels regularly (every 3-4 mo on average) to keep levels within normal limits and prevent over supplementation. - pt's questions and concerns regarding this condition addressed.  Refill- Vitamin D, Ergocalciferol, (DRISDOL) 1.25 MG (50000 UNIT) CAPS capsule; 1 capsule by mouth every 7 days.  Dispense: 4 capsule; Refill: 0  4. BMI 37.0-37.9, adult-current bmi 37.8  5. Morbid obesity (HCC)-start bmi 39.94 Continue PNP and exercise.  I recommend she bring in journaling log to next office visit.  Kara King is currently in the action stage of change. As such, her goal is to continue with weight loss efforts. She has agreed to keeping a food  journal and adhering to recommended goals of 1000 calories and 80 protein.   Exercise goals:  As is.  Behavioral modification strategies: emotional eating strategies and keeping a strict food journal.  Kara King has agreed to follow-up with our clinic in 4 weeks. She was informed of the importance of frequent follow-up visits to maximize her success with intensive lifestyle modifications for her multiple health conditions.   Objective:   Blood pressure 118/68, pulse 61, temperature 98.4 F (36.9 C), height '5\' 5"'$  (1.651 m), weight 227 lb 6.4 oz (103.1 kg), last menstrual period 09/30/2016, SpO2 97 %. Body mass index is 37.84 kg/m.  General: Cooperative, alert, well developed, in no acute distress. HEENT: Conjunctivae and lids unremarkable. Cardiovascular: Regular rhythm.  Lungs: Normal work of breathing. Neurologic: No focal deficits.   Lab Results  Component Value Date   CREATININE 0.71 02/09/2022   BUN 20 02/09/2022   NA 137 02/09/2022   K 4.0 02/09/2022   CL 101 02/09/2022   CO2 27 02/09/2022   Lab Results  Component Value Date   ALT 14 02/09/2022   AST 19 02/09/2022   ALKPHOS 53 02/09/2022   BILITOT 0.4 02/09/2022   Lab Results  Component Value Date   HGBA1C 5.5 07/03/2021   HGBA1C 5.6 08/08/2020   Lab Results  Component Value Date   INSULIN 2.2 (L) 07/03/2021   INSULIN 4.5 08/08/2020   Lab Results  Component Value Date   TSH 1.24 02/09/2022   Lab Results  Component Value Date   CHOL 205 (H) 02/09/2022   HDL 69.50 02/09/2022   LDLCALC 124 (H) 02/09/2022   TRIG 58.0 02/09/2022   CHOLHDL 3 02/09/2022   Lab Results  Component Value Date   VD25OH 27.99 (L) 02/09/2022   VD25OH 71.9 07/03/2021   VD25OH 55.6 12/18/2020   Lab Results  Component Value Date   WBC 6.1 02/09/2022   HGB 13.9 02/09/2022   HCT 41.7 02/09/2022   MCV 88.5 02/09/2022   PLT 277.0 02/09/2022   No results found for: "IRON", "TIBC", "FERRITIN"  Attestation Statements:    Reviewed by clinician on day of visit: allergies, medications, problem list, medical history, surgical history, family history, social history, and previous encounter notes.  I, Davy Pique, RMA, am acting as Location manager for [employer], DO.   I have reviewed the above documentation for completeness, and I agree with the above. Marjory Sneddon, D.O.  The [place] was signed into law in 2016 which includes the topic of electronic health records.  This provides immediate access to information in MyChart.  This includes consultation notes, operative notes, office notes, lab results and pathology reports.  If you have any questions about what you read please let us know at your next visit so we can discuss your concerns and take corrective action if need be.  We are right here with you.

## 2022-09-10 ENCOUNTER — Encounter: Payer: Self-pay | Admitting: Family Medicine

## 2022-09-10 ENCOUNTER — Other Ambulatory Visit: Payer: Self-pay | Admitting: Family Medicine

## 2022-09-10 ENCOUNTER — Ambulatory Visit (INDEPENDENT_AMBULATORY_CARE_PROVIDER_SITE_OTHER): Payer: 59 | Admitting: Family Medicine

## 2022-09-10 DIAGNOSIS — G43719 Chronic migraine without aura, intractable, without status migrainosus: Secondary | ICD-10-CM

## 2022-09-10 MED ORDER — RIZATRIPTAN BENZOATE 10 MG PO TABS: ORAL_TABLET | ORAL | 0 refills | Status: DC

## 2022-09-18 ENCOUNTER — Ambulatory Visit: Payer: 59 | Admitting: Family Medicine

## 2022-09-18 ENCOUNTER — Encounter: Payer: Self-pay | Admitting: Family Medicine

## 2022-09-18 ENCOUNTER — Ambulatory Visit (INDEPENDENT_AMBULATORY_CARE_PROVIDER_SITE_OTHER)
Admission: RE | Admit: 2022-09-18 | Discharge: 2022-09-18 | Disposition: A | Discharge status: Home / Self Care | Payer: 59 | Source: Ambulatory Visit | Attending: Family Medicine | Admitting: Family Medicine

## 2022-09-18 VITALS — BP 122/80 | HR 63 | Temp 98.5°F | Ht 65.0 in | Wt 233.1 lb

## 2022-09-18 DIAGNOSIS — M79672 Pain in left foot: Secondary | ICD-10-CM

## 2022-09-18 DIAGNOSIS — S99922A Unspecified injury of left foot, initial encounter: Secondary | ICD-10-CM | POA: Diagnosis not present

## 2022-09-18 NOTE — Patient Instructions (Signed)
Please get your X-ray done in the basement of our [place] office located on: [address] [place],  [address]  You do not need an appointment for that location.   Ice/cold pack over area for 10-15 min twice daily.  OK to take Tylenol 1000 mg (2 extra strength tabs) or 975 mg (3 regular strength tabs) every 6 hours as needed.  Use your flat soled shoe as needed.  We will be in touch regarding your results.  Let us know if you need anything.

## 2022-09-18 NOTE — Progress Notes (Signed)
Musculoskeletal Exam  Patient: Kara King DOB: 1965-12-18  DOS: 09/18/2022  SUBJECTIVE:  Chief Complaint:   Chief Complaint  Patient presents with   Foot Pain    Left [place] walking her dogs    Kara King is a 57 y.o.  female for evaluation and treatment of L foot pain.   Onset:  1 day ago. Was walking nad hit great toe on uneven sidewalk Location: tender on bottom of foot near great toe Character:   soreness   Progression of issue:  is unchanged Associated symptoms: slight swelling, inability to extend great toe No bruising, redness Treatment: to date has been ibuprofen, elevation, ice.   Neurovascular symptoms: no  Past Medical History:  Diagnosis Date   Allergy    Asthma    Constipation    Depression    Migraine headache    SOBOE (shortness of breath on exertion)    Swelling of both lower extremities    Varicose veins    Vitamin D deficiency     Objective: VITAL SIGNS: BP 122/80 (BP Location: Left Arm, Patient Position: Sitting, Cuff Size: Normal)   Pulse 63   Temp 98.5 F (36.9 C) (Oral)   Ht '5\' 5"'$  (1.651 m)   Wt 233 lb 2 oz (105.7 kg)   LMP 09/30/2016   SpO2 99%   BMI 38.79 kg/m  Constitutional: Well formed, well developed. No acute distress. Thorax & Lungs: No accessory muscle use Musculoskeletal: L foot.   Normal active range of motion: no.   Normal passive range of motion: no Tenderness to palpation: Yes, over the extensor tendon and plantar surface of the distal first metatarsal; no pain with axial loading of the digit I do not appreciate any erythema, warmth, or edema Deformity: no Ecchymosis: no Neurologic: Normal sensory function. No focal deficits noted.  Gait is antalgic. Psychiatric: Normal mood. Age appropriate judgment and insight. Alert & oriented x 3.    Assessment:  Left foot pain - Plan: DG Foot Complete Left  Plan: She has a flat soled shoe at home that she will start wearing as needed, ice, Tylenol.  Check an x-ray to  rule out any fracture through the joint or displacement. F/u prn. The patient voiced understanding and agreement to the plan.   [place], DO 09/18/22  2:29 PM

## 2022-10-08 ENCOUNTER — Ambulatory Visit (INDEPENDENT_AMBULATORY_CARE_PROVIDER_SITE_OTHER): Payer: 59 | Admitting: Family Medicine

## 2022-11-03 ENCOUNTER — Encounter: Payer: Self-pay | Admitting: *Deleted

## 2022-11-03 ENCOUNTER — Other Ambulatory Visit: Payer: Self-pay | Admitting: *Deleted

## 2022-11-03 DIAGNOSIS — G43719 Chronic migraine without aura, intractable, without status migrainosus: Secondary | ICD-10-CM

## 2022-11-03 MED ORDER — RIZATRIPTAN BENZOATE 10 MG PO TABS: ORAL_TABLET | ORAL | 0 refills | Status: DC

## 2022-12-14 ENCOUNTER — Other Ambulatory Visit (INDEPENDENT_AMBULATORY_CARE_PROVIDER_SITE_OTHER): Payer: Self-pay | Admitting: Family Medicine

## 2022-12-14 ENCOUNTER — Other Ambulatory Visit: Payer: Self-pay | Admitting: Family Medicine

## 2022-12-14 DIAGNOSIS — E559 Vitamin D deficiency, unspecified: Secondary | ICD-10-CM

## 2022-12-14 DIAGNOSIS — G43719 Chronic migraine without aura, intractable, without status migrainosus: Secondary | ICD-10-CM

## 2022-12-14 MED ORDER — RIZATRIPTAN BENZOATE 10 MG PO TABS: ORAL_TABLET | ORAL | 0 refills | Status: DC

## 2022-12-15 ENCOUNTER — Telehealth: Payer: Self-pay | Admitting: Family Medicine

## 2022-12-18 ENCOUNTER — Encounter: Payer: Self-pay | Admitting: Family Medicine

## 2022-12-18 ENCOUNTER — Ambulatory Visit: Payer: 59 | Admitting: Family Medicine

## 2022-12-18 VITALS — BP 122/74 | HR 57 | Temp 98.7°F | Resp 16 | Ht 65.0 in | Wt 247.8 lb

## 2022-12-18 DIAGNOSIS — Z6841 Body Mass Index (BMI) 40.0 and over, adult: Secondary | ICD-10-CM | POA: Diagnosis not present

## 2022-12-18 DIAGNOSIS — E559 Vitamin D deficiency, unspecified: Secondary | ICD-10-CM

## 2022-12-18 DIAGNOSIS — G43719 Chronic migraine without aura, intractable, without status migrainosus: Secondary | ICD-10-CM

## 2022-12-18 MED ORDER — TOPIRAMATE 25 MG PO TABS: ORAL_TABLET | ORAL | 0 refills | Status: AC

## 2022-12-18 MED ORDER — RIZATRIPTAN BENZOATE 10 MG PO TABS: ORAL_TABLET | ORAL | 1 refills | Status: DC

## 2022-12-18 MED ORDER — VITAMIN D (ERGOCALCIFEROL) 1.25 MG (50000 UNIT) PO CAPS: ORAL_CAPSULE | ORAL | 3 refills | Status: DC

## 2022-12-18 NOTE — Progress Notes (Addendum)
Subjective:   By signing my name below, I, Carlena Bjornstad, attest that this documentation has been prepared under the direction and in the presence of Seabron Spates R, DO. 12/18/2022.   Patient ID: Kara King, female    DOB: [birth year], 57 y.o.   MRN: [id number]  Chief Complaint  Patient presents with   Follow-up    Medication follow up    HPI Patient is in today for an office visit.  Generally she is feeling well today. On average she has a resting heart rate in the low 50's.  She is requesting refills for her vitamin D supplement. She has noticed that her vitamin D levels are typically low unless she has the prescription strength supplement.  At this time she has stopped taking Topamax; during the first week of taking it the medication caused her to feel terrible. Her migraines have worsened since she discontinued the Topamax. She is considering taking [place]'s [place] or mushroom powder supplements. However, she is hesitant as some natural supplements will cause her to experience headaches.  Additionally she had developed a strange visual disturbance one hour after taking her Maxalt on two occasions. She describes this as seeing "frills on the fringe of her vision". She does note that when she experienced this symptom, she had been on the Topamax simultaneously. Yesterday, she took a Maxalt tablet and this visual symptom did not occur; she was off of the Topamax. She is amenable to trying Topamax at a lower dose.   Currently she is no longer going to the [facility] program. Lately she has had significant work issues and she admits to struggling with emotional eating. She does try to exercise but has not been as consistent lately.   Past Medical History:  Diagnosis Date   Allergy    Asthma    Constipation    Depression    Migraine headache    SOBOE (shortness of breath on exertion)    Swelling of both lower extremities    Varicose veins    Vitamin D  deficiency     Past Surgical History:  Procedure Laterality Date   G 3 P 3     TUBAL LIGATION      Family History  Adopted: Yes  Problem Relation Age of Onset   Alcohol abuse Father    Heart attack Brother 55        smoker   Heart disease Brother    Hypertension Brother    Uterine cancer Maternal Grandmother    Cancer Maternal Grandmother    Stroke Paternal Grandmother        > 33   Diabetes Paternal Grandmother    Colon cancer Neg Hx    Esophageal cancer Neg Hx    Rectal cancer Neg Hx    Stomach cancer Neg Hx     Social History   Socioeconomic History   Marital status: Single    Spouse name: Not on file   Number of children: 3   Years of education: Not on file   Highest education level: Bachelor's degree (e.g., BA, AB, BS)  Occupational History   Occupation: Print production planner & sales    Comment: security [employer]  Tobacco Use   Smoking status: Former    Packs/day: 2    Types: Cigarettes    Quit date: 01/03/2011    Years since quitting: 11.9   Smokeless tobacco: Never   Tobacco comments:    smoked age [age] (not with pregnancies ,  up to 2 ppd  Vaping Use   Vaping Use: Never used  Substance and Sexual Activity   Alcohol use: Yes    Comment: 1-2 times a month   Drug use: No   Sexual activity: Never  Other Topics Concern   Not on file  Social History Narrative   Exericse---  4x a week    Social Determinants of Health   Financial Resource Strain: Medium Risk (12/17/2022)   Overall Financial Resource Strain (CARDIA)    Difficulty of Paying Living Expenses: Somewhat hard  Food Insecurity: No Food Insecurity (12/17/2022)   Hunger Vital Sign    Worried About Running Out of Food in the Last Year: Never true    Ran Out of Food in the Last Year: Never true  Transportation Needs: No Transportation Needs (12/17/2022)   PRAPARE - Administrator, Civil Service (Medical): No    Lack of Transportation (Non-Medical): No  Physical Activity: Insufficiently Active  (12/17/2022)   Exercise Vital Sign    Days of Exercise per Week: 3 days    Minutes of Exercise per Session: 30 min  Stress: No Stress Concern Present (12/17/2022)   [employer] of Occupational Health - Occupational Stress Questionnaire    Feeling of Stress : Not at all  Social Connections: Moderately Integrated (12/17/2022)   Social Connection and Isolation Panel [NHANES]    Frequency of Communication with Friends and Family: More than three times a week    Frequency of Social Gatherings with Friends and Family: Three times a week    Attends Religious Services: More than 4 times per year    Active Member of Clubs or Organizations: Yes    Attends Engineer, structural: More than 4 times per year    Marital Status: Divorced  Catering manager Violence: Not on file    Outpatient Medications Prior to Visit  Medication Sig Dispense Refill   albuterol (VENTOLIN HFA) 108 (90 Base) MCG/ACT inhaler Inhale 2 puffs into the lungs every 6 (six) hours as needed for shortness of breath or wheezing (cough). 18 g 0   Multiple Vitamin (MULTIVITAMIN) tablet Take 1 tablet by mouth daily.     NONFORMULARY OR COMPOUNDED ITEM Compression stocking 20-50mm/hg #1  As directed 1 each 0   topiramate (TOPAMAX) 50 MG tablet Take 1 tablet (50 mg total) by mouth 2 (two) times daily. 180 tablet 3   rizatriptan (MAXALT) 10 MG tablet TAKE 1 TABLET BY MOUTH ONCE DAILY AS NEEDED FOR HEADACHE, MAY REPEAT IN 2 HOURS IF NEEDED. MAX 2/24 HOURS 12 tablet 0   Vitamin D, Ergocalciferol, (DRISDOL) 1.25 MG (50000 UNIT) CAPS capsule 1 capsule by mouth every 7 days. 4 capsule 0   Polyethylene Glycol 3350 (MIRALAX PO) Take by mouth. daily     No facility-administered medications prior to visit.    Allergies  Allergen Reactions   Wellbutrin [Bupropion]     01/30/13 dyspnea (MyChart message)   Effexor [Venlafaxine]     "Confusion"    Review of Systems  Constitutional:  Negative for fever and malaise/fatigue.  HENT:   Negative for congestion.   Eyes:  Negative for blurred vision.       + Visual disturbances after taking Maxalt with Topamax  Respiratory:  Negative for shortness of breath.   Cardiovascular:  Negative for chest pain, palpitations and leg swelling.  Gastrointestinal:  Negative for abdominal pain, blood in stool and nausea.  Genitourinary:  Negative for dysuria and frequency.  Musculoskeletal:  Negative for falls.  Skin:  Negative for rash.  Neurological:  Positive for headaches (Migraines). Negative for dizziness and loss of consciousness.  Endo/Heme/Allergies:  Negative for environmental allergies.  Psychiatric/Behavioral:  Negative for depression. The patient is not nervous/anxious.        Objective:    Physical Exam Vitals and nursing note reviewed.  Constitutional:      Appearance: Normal appearance.  HENT:     Head: Normocephalic and atraumatic.     Right Ear: Tympanic membrane, ear canal and external ear normal.     Left Ear: Tympanic membrane, ear canal and external ear normal.  Eyes:     Extraocular Movements: Extraocular movements intact.     Pupils: Pupils are equal, round, and reactive to light.  Cardiovascular:     Rate and Rhythm: Normal rate and regular rhythm.     Heart sounds: Normal heart sounds. No murmur heard.    No gallop.  Pulmonary:     Effort: Pulmonary effort is normal. No respiratory distress.     Breath sounds: Normal breath sounds. No wheezing or rales.  Skin:    General: Skin is warm and dry.  Neurological:     General: No focal deficit present.     Mental Status: She is alert and oriented to person, place, and time.  Psychiatric:        Mood and Affect: Mood normal.        Behavior: Behavior normal.     BP 122/74 (BP Location: Left Arm, Patient Position: Sitting, Cuff Size: Normal)   Pulse (!) 57   Temp 98.7 F (37.1 C) (Oral)   Resp 16   Ht 5\' 5"  (1.651 m)   Wt 247 lb 12.8 oz (112.4 kg)   LMP 09/30/2016   SpO2 95%   BMI 41.24 kg/m   Wt Readings from Last 3 Encounters:  12/18/22 247 lb 12.8 oz (112.4 kg)  09/18/22 233 lb 2 oz (105.7 kg)  08/13/22 227 lb 6.4 oz (103.1 kg)    Diabetic Foot Exam - Simple   No data filed    Lab Results  Component Value Date   WBC 6.1 02/09/2022   HGB 13.9 02/09/2022   HCT 41.7 02/09/2022   PLT 277.0 02/09/2022   GLUCOSE 86 02/09/2022   CHOL 205 (H) 02/09/2022   TRIG 58.0 02/09/2022   HDL 69.50 02/09/2022   LDLCALC 124 (H) 02/09/2022   ALT 14 02/09/2022   AST 19 02/09/2022   NA 137 02/09/2022   K 4.0 02/09/2022   CL 101 02/09/2022   CREATININE 0.71 02/09/2022   BUN 20 02/09/2022   CO2 27 02/09/2022   TSH 1.24 02/09/2022   HGBA1C 5.5 07/03/2021    Lab Results  Component Value Date   TSH 1.24 02/09/2022   Lab Results  Component Value Date   WBC 6.1 02/09/2022   HGB 13.9 02/09/2022   HCT 41.7 02/09/2022   MCV 88.5 02/09/2022   PLT 277.0 02/09/2022   Lab Results  Component Value Date   NA 137 02/09/2022   K 4.0 02/09/2022   CO2 27 02/09/2022   GLUCOSE 86 02/09/2022   BUN 20 02/09/2022   CREATININE 0.71 02/09/2022   BILITOT 0.4 02/09/2022   ALKPHOS 53 02/09/2022   AST 19 02/09/2022   ALT 14 02/09/2022   PROT 6.2 02/09/2022   ALBUMIN 4.1 02/09/2022   CALCIUM 9.1 02/09/2022   EGFR 103 07/03/2021   GFR 95.25 02/09/2022   Lab Results  Component  Value Date   CHOL 205 (H) 02/09/2022   Lab Results  Component Value Date   HDL 69.50 02/09/2022   Lab Results  Component Value Date   LDLCALC 124 (H) 02/09/2022   Lab Results  Component Value Date   TRIG 58.0 02/09/2022   Lab Results  Component Value Date   CHOLHDL 3 02/09/2022   Lab Results  Component Value Date   HGBA1C 5.5 07/03/2021       Assessment & Plan:   Problem List Items Addressed This Visit       Unprioritized   Vitamin D deficiency    Vita d weekly Check labs at [facility]      Relevant Medications   Vitamin D, Ergocalciferol, (DRISDOL) 1.25 MG (50000 UNIT) CAPS capsule    Morbid obesity (HCC)-start bmi 39.94    Topamax may help  Pt will start exercise again      Intractable chronic migraine without aura and without status migrainosus - Primary    Restart topamax 25 mg qd for 1 week then bid , then 1 in am and 2 in pm Then back on 50 mg bid Con't maxalt  F/u 3 months      Relevant Medications   topiramate (TOPAMAX) 25 MG tablet   rizatriptan (MAXALT) 10 MG tablet     Meds ordered this encounter  Medications   Vitamin D, Ergocalciferol, (DRISDOL) 1.25 MG (50000 UNIT) CAPS capsule    Sig: 1 capsule by mouth every 7 days.    Dispense:  12 capsule    Refill:  3   topiramate (TOPAMAX) 25 MG tablet    Sig: 1 po qd x 1 week then 1 po bid x 1 week then 1 po qam and 2 po qhs    Dispense:  42 tablet    Refill:  0   rizatriptan (MAXALT) 10 MG tablet    Sig: TAKE 1 TABLET BY MOUTH ONCE DAILY AS NEEDED FOR HEADACHE, MAY REPEAT IN 2 HOURS IF NEEDED. MAX 2/24 HOURS    Dispense:  12 tablet    Refill:  1    I, Donato Schultz, DO, personally preformed the services described in this documentation.  All medical record entries made by the scribe were at my direction and in my presence.  I have reviewed the chart and discharge instructions (if applicable) and agree that the record reflects my personal performance and is accurate and complete. 12/18/2022.  I,Mathew Stumpf,acting as a Neurosurgeon for [employer], DO.,have documented all relevant documentation on the behalf of Donato Schultz, DO,as directed by  Donato Schultz, DO while in the presence of Donato Schultz, DO.   Donato Schultz, DO

## 2022-12-18 NOTE — Assessment & Plan Note (Signed)
Restart topamax 25 mg qd for 1 week then bid , then 1 in am and 2 in pm Then back on 50 mg bid Con't maxalt  F/u 3 months

## 2022-12-18 NOTE — Patient Instructions (Signed)

## 2022-12-18 NOTE — Assessment & Plan Note (Signed)
Vita d weekly Check labs at [facility]

## 2022-12-18 NOTE — Assessment & Plan Note (Signed)
Topamax may help  Pt will start exercise again

## 2023-02-25 ENCOUNTER — Encounter (INDEPENDENT_AMBULATORY_CARE_PROVIDER_SITE_OTHER): Payer: Self-pay

## 2023-03-22 ENCOUNTER — Encounter: Payer: 59 | Admitting: Family Medicine

## 2023-04-09 NOTE — Telephone Encounter (Signed)
error 

## 2023-04-22 ENCOUNTER — Ambulatory Visit (INDEPENDENT_AMBULATORY_CARE_PROVIDER_SITE_OTHER): Payer: 59 | Admitting: Family Medicine

## 2023-04-22 ENCOUNTER — Encounter: Payer: Self-pay | Admitting: Family Medicine

## 2023-04-22 DIAGNOSIS — J209 Acute bronchitis, unspecified: Secondary | ICD-10-CM | POA: Diagnosis not present

## 2023-04-22 DIAGNOSIS — G43719 Chronic migraine without aura, intractable, without status migrainosus: Secondary | ICD-10-CM

## 2023-04-22 DIAGNOSIS — E559 Vitamin D deficiency, unspecified: Secondary | ICD-10-CM | POA: Diagnosis not present

## 2023-04-22 DIAGNOSIS — Z0000 Encounter for general adult medical examination without abnormal findings: Secondary | ICD-10-CM

## 2023-04-22 DIAGNOSIS — E7849 Other hyperlipidemia: Secondary | ICD-10-CM

## 2023-04-22 DIAGNOSIS — G43909 Migraine, unspecified, not intractable, without status migrainosus: Secondary | ICD-10-CM

## 2023-04-22 MED ORDER — VITAMIN D (ERGOCALCIFEROL) 1.25 MG (50000 UNIT) PO CAPS: ORAL_CAPSULE | ORAL | 3 refills | Status: AC

## 2023-04-22 MED ORDER — ALBUTEROL SULFATE HFA 108 (90 BASE) MCG/ACT IN AERS: 2.0000 | INHALATION_SPRAY | Freq: Four times a day (QID) | RESPIRATORY_TRACT | 5 refills | Status: DC | PRN

## 2023-04-22 MED ORDER — RIZATRIPTAN BENZOATE 10 MG PO TABS: ORAL_TABLET | ORAL | 1 refills | Status: DC

## 2023-04-22 NOTE — Assessment & Plan Note (Signed)
Encourage heart healthy diet such as MIND or DASH diet, increase exercise, avoid trans fats, simple carbohydrates and processed foods, consider a krill or fish or flaxseed oil cap daily.  °

## 2023-04-22 NOTE — Assessment & Plan Note (Signed)
Refill maxalt.  

## 2023-04-22 NOTE — Assessment & Plan Note (Signed)
Ghm utd Check labs  See [facility] Health Maintenance  Topic Date Due   HIV Screening  Never done   Hepatitis C Screening  Never done   Cervical Cancer Screening (HPV/Pap Cotest)  08/13/2018   COVID-19 Vaccine (3 - 2023-24 season) 03/14/2023   INFLUENZA VACCINE  10/11/2023 (Originally 02/11/2023)   MAMMOGRAM  02/05/2024   Colonoscopy  09/13/2026   DTaP/Tdap/Td (2 - Td or Tdap) 01/29/2031   Zoster Vaccines- Shingrix  Completed   HPV VACCINES  Aged Out

## 2023-04-22 NOTE — Progress Notes (Signed)
Established Patient Office Visit  Subjective   Patient ID: Kara King, female    DOB: 1965-11-10  Age: 57 y.o. MRN: [id number]  Chief Complaint  Patient presents with   Annual Exam    Pt states not fasting     HPI Discussed the use of AI scribe software for clinical note transcription with the patient, who gave verbal consent to proceed.  History of Present Illness   The patient, with a history of migraines and asthma, presents for a routine physical exam. She reports no changes in family history or surgical history. She is currently employed at [employer] and is single. She expresses dissatisfaction with her current job and is actively seeking new employment, preferably in an office setting. She is also seeking a job with better benefits, particularly health insurance.  The patient has been struggling with weight gain, which has led to increased knee pain, particularly when climbing stairs. She has previously tried a weight loss program at [facility], but had to stop due to the high cost of the program. She is considering rejoining the program or trying an online program like Weight Watchers or MyFitnessPal.  The patient has a history of elevated glucose levels and has been considering trying Wegovy for weight loss, but is waiting to see if it will be covered by insurance in the new year and if it has any serious side effects. She has also been considering trying Zepbound for weight loss, but has had trouble getting it covered by insurance.  The patient is currently taking albuterol for asthma and rizatriptan for migraines, and requests refills for both medications. She also requests a refill for vitamin D. She has not had a flu shot and does not plan to get one, as she is not around many people. She is due for a gynecological exam and plans to schedule an appointment.      Patient Active Problem List   Diagnosis Date Noted   BMI 37.0-37.9, adult-current  bmi 37.8 08/13/2022   Morbid obesity (HCC)-start bmi 39.94 08/13/2022   Stress and adjustment reaction 03/23/2022   Impaired fasting glucose with polypagia 12/03/2021   Eating disorder 11/05/2021   Class 2 severe obesity with serious comorbidity and body mass index (BMI) of 39.0 to 39.9 in adult [facility]) 07/28/2021   Polyphagia 07/28/2021   Preventative health care 01/28/2021   Nonintractable headache 11/28/2020   At risk for side effect of medication 11/28/2020   Hypernatremia 11/12/2020   At risk for dehydration 09/30/2020   At risk for impaired metabolic function 09/16/2020   Other hyperlipidemia 08/21/2020   Mild dehydration 08/21/2020   At risk for heart disease 08/21/2020   Other fatigue 08/08/2020   SOBOE (shortness of breath on exertion) 08/08/2020   Peripheral edema 08/08/2020   Vitamin D deficiency 08/08/2020   Other constipation 08/08/2020   Depression 08/08/2020   At risk for malnutrition 08/08/2020   Intractable chronic migraine without aura and without status migrainosus 07/31/2020   Acute bronchitis 07/31/2020   Lower extremity edema 07/31/2020   Symptomatic spider varicose vein 07/31/2020   Depression, major, single episode, mild (HCC) 10/17/2016   Bradycardia 09/21/2016   Ventricular bigeminy 01/02/2013   Migraine without status migrainosus, not intractable 01/11/2008   ASTHMA 01/03/2007   Asthma 01/03/2007   Past Medical History:  Diagnosis Date   Allergy    Asthma    Constipation    Depression    Migraine headache    SOBOE (  shortness of breath on exertion)    Swelling of both lower extremities    Varicose veins    Vitamin D deficiency    Past Surgical History:  Procedure Laterality Date   G 3 P 3     TUBAL LIGATION     Social History   Tobacco Use   Smoking status: Former    Current packs/day: 0.00    Types: Cigarettes    Quit date: 01/03/2011    Years since quitting: 12.3   Smokeless tobacco: Never   Tobacco comments:    smoked age [age]  (not with pregnancies , up to 2 ppd  Vaping Use   Vaping status: Never Used  Substance Use Topics   Alcohol use: Yes    Comment: 1-2 times a month   Drug use: No   Social History   Socioeconomic History   Marital status: Single    Spouse name: Not on file   Number of children: 3   Years of education: Not on file   Highest education level: Bachelor's degree (e.g., BA, AB, BS)  Occupational History   Occupation: Print production planner & sales    Comment: security [employer]  Tobacco Use   Smoking status: Former    Current packs/day: 0.00    Types: Cigarettes    Quit date: 01/03/2011    Years since quitting: 12.3   Smokeless tobacco: Never   Tobacco comments:    smoked age [age] (not with pregnancies , up to 2 ppd  Vaping Use   Vaping status: Never Used  Substance and Sexual Activity   Alcohol use: Yes    Comment: 1-2 times a month   Drug use: No   Sexual activity: Never  Other Topics Concern   Not on file  Social History Narrative   Exericse---  4x a week    Social Determinants of Health   Financial Resource Strain: Medium Risk (12/17/2022)   Overall Financial Resource Strain (CARDIA)    Difficulty of Paying Living Expenses: Somewhat hard  Food Insecurity: No Food Insecurity (12/17/2022)   Hunger Vital Sign    Worried About Running Out of Food in the Last Year: Never true    Ran Out of Food in the Last Year: Never true  Transportation Needs: No Transportation Needs (12/17/2022)   PRAPARE - Administrator, Civil Service (Medical): No    Lack of Transportation (Non-Medical): No  Physical Activity: Insufficiently Active (12/17/2022)   Exercise Vital Sign    Days of Exercise per Week: 3 days    Minutes of Exercise per Session: 30 min  Stress: No Stress Concern Present (12/17/2022)   [employer] of Occupational Health - Occupational Stress Questionnaire    Feeling of Stress : Not at all  Social Connections: Moderately Integrated (12/17/2022)   Social Connection and Isolation  Panel [NHANES]    Frequency of Communication with Friends and Family: More than three times a week    Frequency of Social Gatherings with Friends and Family: Three times a week    Attends Religious Services: More than 4 times per year    Active Member of Clubs or Organizations: Yes    Attends Engineer, structural: More than 4 times per year    Marital Status: Divorced  Catering manager Violence: Not on file   Family Status  Relation Name Status   Mother  Alive   Father  Deceased   Brother  Deceased   MGM  Deceased   MGF  Deceased   PGM  Deceased   PGF  Deceased   Neg Hx  (Not Specified)  No partnership data on file   Family History  Adopted: Yes  Problem Relation Age of Onset   Alcohol abuse Father    Heart attack Brother 81        smoker   Heart disease Brother    Hypertension Brother    Uterine cancer Maternal Grandmother    Cancer Maternal Grandmother    Stroke Paternal Grandmother        > 61   Diabetes Paternal Grandmother    Colon cancer Neg Hx    Esophageal cancer Neg Hx    Rectal cancer Neg Hx    Stomach cancer Neg Hx    Allergies  Allergen Reactions   Wellbutrin [Bupropion]     01/30/13 dyspnea (MyChart message)   Effexor [Venlafaxine]     "Confusion"      Review of Systems  Constitutional:  Negative for chills, fever and malaise/fatigue.  HENT:  Negative for congestion and hearing loss.   Eyes:  Negative for blurred vision and discharge.  Respiratory:  Negative for cough, sputum production and shortness of breath.   Cardiovascular:  Negative for chest pain, palpitations and leg swelling.  Gastrointestinal:  Negative for abdominal pain, blood in stool, constipation, diarrhea, heartburn, nausea and vomiting.  Genitourinary:  Negative for dysuria, frequency, hematuria and urgency.  Musculoskeletal:  Negative for back pain, falls and myalgias.  Skin:  Negative for rash.  Neurological:  Negative for dizziness, sensory change, loss of  consciousness, weakness and headaches.  Endo/Heme/Allergies:  Negative for environmental allergies. Does not bruise/bleed easily.  Psychiatric/Behavioral:  Negative for depression and suicidal ideas. The patient is not nervous/anxious and does not have insomnia.       Objective:     BP 112/80 (BP Location: Left Arm, Patient Position: Sitting, Cuff Size: Large)   Pulse 71   Temp 98.6 F (37 C) (Oral)   Resp 18   Ht 5\' 5"  (1.651 m)   Wt 259 lb 6.4 oz (117.7 kg)   LMP 09/30/2016   SpO2 98%   BMI 43.17 kg/m  BP Readings from Last 3 Encounters:  04/22/23 112/80  12/18/22 122/74  09/18/22 122/80   Wt Readings from Last 3 Encounters:  04/22/23 259 lb 6.4 oz (117.7 kg)  12/18/22 247 lb 12.8 oz (112.4 kg)  09/18/22 233 lb 2 oz (105.7 kg)   SpO2 Readings from Last 3 Encounters:  04/22/23 98%  12/18/22 95%  09/18/22 99%      Physical Exam Vitals and nursing note reviewed.  Constitutional:      General: She is not in acute distress.    Appearance: Normal appearance. She is well-developed.  HENT:     Head: Normocephalic and atraumatic.     Right Ear: Tympanic membrane, ear canal and external ear normal. There is no impacted cerumen.     Left Ear: Tympanic membrane, ear canal and external ear normal. There is no impacted cerumen.     Nose: Nose normal.     Mouth/Throat:     Mouth: Mucous membranes are moist.     Pharynx: Oropharynx is clear. No oropharyngeal exudate or posterior oropharyngeal erythema.  Eyes:     General: No scleral icterus.       Right eye: No discharge.        Left eye: No discharge.     Conjunctiva/sclera: Conjunctivae normal.     Pupils: Pupils are  equal, round, and reactive to light.  Neck:     Thyroid: No thyromegaly or thyroid tenderness.     Vascular: No JVD.  Cardiovascular:     Rate and Rhythm: Normal rate and regular rhythm.     Heart sounds: Normal heart sounds. No murmur heard. Pulmonary:     Effort: Pulmonary effort is normal. No  respiratory distress.     Breath sounds: Normal breath sounds.  Abdominal:     General: Bowel sounds are normal. There is no distension.     Palpations: Abdomen is soft. There is no mass.     Tenderness: There is no abdominal tenderness. There is no guarding or rebound.  Genitourinary:    Vagina: Normal.  Musculoskeletal:        General: Normal range of motion.     Cervical back: Normal range of motion and neck supple.     Right lower leg: No edema.     Left lower leg: No edema.  Lymphadenopathy:     Cervical: No cervical adenopathy.  Skin:    General: Skin is warm and dry.     Findings: No erythema or rash.  Neurological:     Mental Status: She is alert and oriented to person, place, and time.     Cranial Nerves: No cranial nerve deficit.     Deep Tendon Reflexes: Reflexes are normal and symmetric.  Psychiatric:        Mood and Affect: Mood normal.        Behavior: Behavior normal.        Thought Content: Thought content normal.        Judgment: Judgment normal.      No results found for any visits on 04/22/23.  Last CBC Lab Results  Component Value Date   WBC 6.1 02/09/2022   HGB 13.9 02/09/2022   HCT 41.7 02/09/2022   MCV 88.5 02/09/2022   MCH 29.0 08/08/2020   RDW 13.6 02/09/2022   PLT 277.0 02/09/2022   Last metabolic panel Lab Results  Component Value Date   GLUCOSE 86 02/09/2022   NA 137 02/09/2022   K 4.0 02/09/2022   CL 101 02/09/2022   CO2 27 02/09/2022   BUN 20 02/09/2022   CREATININE 0.71 02/09/2022   GFR 95.25 02/09/2022   CALCIUM 9.1 02/09/2022   PROT 6.2 02/09/2022   ALBUMIN 4.1 02/09/2022   LABGLOB 1.8 12/18/2020   AGRATIO 2.4 (H) 12/18/2020   BILITOT 0.4 02/09/2022   ALKPHOS 53 02/09/2022   AST 19 02/09/2022   ALT 14 02/09/2022   Last lipids Lab Results  Component Value Date   CHOL 205 (H) 02/09/2022   HDL 69.50 02/09/2022   LDLCALC 124 (H) 02/09/2022   TRIG 58.0 02/09/2022   CHOLHDL 3 02/09/2022   Last hemoglobin A1c Lab  Results  Component Value Date   HGBA1C 5.5 07/03/2021   Last thyroid functions Lab Results  Component Value Date   TSH 1.24 02/09/2022   T3TOTAL 104 08/08/2020   Last vitamin D Lab Results  Component Value Date   VD25OH 27.99 (L) 02/09/2022   Last vitamin B12 and Folate Lab Results  Component Value Date   VITAMINB12 401 02/09/2022   FOLATE >20.0 08/08/2020      The 10-year ASCVD risk score (Arnett DK, et al., 2019) is: 1.6%    Assessment & Plan:   Problem List Items Addressed This Visit       Unprioritized   Acute bronchitis   Relevant Medications  albuterol (VENTOLIN HFA) 108 (90 Base) MCG/ACT inhaler   Vitamin D deficiency   Relevant Medications   Vitamin D, Ergocalciferol, (DRISDOL) 1.25 MG (50000 UNIT) CAPS capsule   Other Relevant Orders   VITAMIN D 25 Hydroxy (Vit-D Deficiency, Fractures)   Morbid obesity (HCC)-start bmi 39.94 - Primary   Relevant Orders   CBC with Differential/Platelet   Comprehensive metabolic panel   Lipid panel   TSH   Hemoglobin A1c   Insulin, random   Intractable chronic migraine without aura and without status migrainosus   Relevant Medications   rizatriptan (MAXALT) 10 MG tablet   Preventative health care    Ghm utd Check labs  See AVS Health Maintenance  Topic Date Due   HIV Screening  Never done   Hepatitis C Screening  Never done   Cervical Cancer Screening (HPV/Pap Cotest)  08/13/2018   COVID-19 Vaccine (3 - 2023-24 season) 03/14/2023   INFLUENZA VACCINE  10/11/2023 (Originally 02/11/2023)   MAMMOGRAM  02/05/2024   Colonoscopy  09/13/2026   DTaP/Tdap/Td (2 - Td or Tdap) 01/29/2031   Zoster Vaccines- Shingrix  Completed   HPV VACCINES  Aged Out         Relevant Orders   CBC with Differential/Platelet   Comprehensive metabolic panel   Lipid panel   TSH   Hemoglobin A1c   Insulin, random   Other hyperlipidemia    Encourage heart healthy diet such as MIND or DASH diet, increase exercise, avoid trans fats,  simple carbohydrates and processed foods, consider a krill or fish or flaxseed oil cap daily.        Migraine without status migrainosus, not intractable    Refill maxalt      Relevant Medications   rizatriptan (MAXALT) 10 MG tablet  Assessment and Plan    Migraine Stable on rizatriptan. No changes in frequency or severity of headaches reported. -Refill rizatriptan prescription.  Asthma No acute exacerbations reported. -Refill albuterol inhaler.  Vitamin D deficiency No new symptoms reported. -Refill Vitamin D supplement.  Obesity Patient reports weight gain and associated knee pain. Previously participated in a weight loss program but stopped due to cost. Discussed potential options including online programs and apps. -Encouraged to consider rejoining weight loss program or trying online options.  Knee pain Likely weight-related. No acute injury reported. -Encouraged weight loss to alleviate symptoms.  General Health Maintenance -Encouraged to schedule gynecological exam. -Declined flu shot, not around many people. -Order labs today.        No follow-ups on file.    Donato Schultz, DO

## 2023-04-23 LAB — COMPREHENSIVE METABOLIC PANEL
ALT: 13 U/L (ref 0–35)
AST: 15 U/L (ref 0–37)
Albumin: 4 g/dL (ref 3.5–5.2)
Alkaline Phosphatase: 64 U/L (ref 39–117)
BUN: 23 mg/dL (ref 6–23)
CO2: 31 meq/L (ref 19–32)
Calcium: 9.3 mg/dL (ref 8.4–10.5)
Chloride: 104 meq/L (ref 96–112)
Creatinine, Ser: 0.62 mg/dL (ref 0.40–1.20)
GFR: 98.92 mL/min (ref >60.00)
Glucose, Bld: 109 mg/dL — ABNORMAL HIGH (ref 70–99)
Potassium: 3.8 meq/L (ref 3.5–5.1)
Sodium: 142 meq/L (ref 135–145)
Total Bilirubin: 0.3 mg/dL (ref 0.2–1.2)
Total Protein: 5.7 g/dL — ABNORMAL LOW (ref 6.0–8.3)

## 2023-04-23 LAB — LIPID PANEL
Cholesterol: 210 mg/dL — ABNORMAL HIGH (ref 0–200)
HDL: 66.8 mg/dL (ref >39.00)
LDL Cholesterol: 112 mg/dL — ABNORMAL HIGH (ref 0–99)
NonHDL: 143.04
Total CHOL/HDL Ratio: 3
Triglycerides: 157 mg/dL — ABNORMAL HIGH (ref 0.0–149.0)
VLDL: 31.4 mg/dL (ref 0.0–40.0)

## 2023-04-23 LAB — CBC WITH DIFFERENTIAL/PLATELET
Basophils Absolute: 0.1 10*3/uL (ref 0.0–0.1)
Basophils Relative: 1.1 % (ref 0.0–3.0)
Eosinophils Absolute: 0.2 10*3/uL (ref 0.0–0.7)
Eosinophils Relative: 2.6 % (ref 0.0–5.0)
HCT: 43.3 % (ref 36.0–46.0)
Hemoglobin: 13.9 g/dL (ref 12.0–15.0)
Lymphocytes Relative: 20.9 % (ref 12.0–46.0)
Lymphs Abs: 1.4 10*3/uL (ref 0.7–4.0)
MCHC: 32.2 g/dL (ref 30.0–36.0)
MCV: 88.4 fL (ref 78.0–100.0)
Monocytes Absolute: 0.3 10*3/uL (ref 0.1–1.0)
Monocytes Relative: 4.9 % (ref 3.0–12.0)
Neutro Abs: 4.7 10*3/uL (ref 1.4–7.7)
Neutrophils Relative %: 70.5 % (ref 43.0–77.0)
Platelets: 313 10*3/uL (ref 150.0–400.0)
RBC: 4.9 Mil/uL (ref 3.87–5.11)
RDW: 13.4 % (ref 11.5–15.5)
WBC: 6.7 10*3/uL (ref 4.0–10.5)

## 2023-04-23 LAB — VITAMIN D 25 HYDROXY (VIT D DEFICIENCY, FRACTURES): VITD: 20.19 ng/mL — ABNORMAL LOW (ref 30.00–100.00)

## 2023-04-23 LAB — INSULIN, RANDOM: Insulin: 38.3 u[IU]/mL — ABNORMAL HIGH

## 2023-04-23 LAB — TSH: TSH: 1.15 u[IU]/mL (ref 0.35–5.50)

## 2023-04-23 LAB — HEMOGLOBIN A1C: Hgb A1c MFr Bld: 5.6 % (ref 4.6–6.5)

## 2023-04-30 ENCOUNTER — Other Ambulatory Visit: Payer: Self-pay

## 2023-04-30 MED ORDER — VITAMIN D (ERGOCALCIFEROL) 1.25 MG (50000 UNIT) PO CAPS: 50000.0000 [IU] | ORAL_CAPSULE | ORAL | 1 refills | Status: AC

## 2023-05-03 ENCOUNTER — Other Ambulatory Visit: Payer: Self-pay

## 2023-05-03 DIAGNOSIS — R300 Dysuria: Secondary | ICD-10-CM

## 2023-05-04 ENCOUNTER — Other Ambulatory Visit (INDEPENDENT_AMBULATORY_CARE_PROVIDER_SITE_OTHER): Payer: 59

## 2023-05-04 DIAGNOSIS — R300 Dysuria: Secondary | ICD-10-CM

## 2023-05-04 LAB — POC URINALSYSI DIPSTICK (AUTOMATED)
Bilirubin, UA: NEGATIVE
Blood, UA: NEGATIVE
Glucose, UA: NEGATIVE
Ketones, UA: NEGATIVE
Nitrite, UA: NEGATIVE
Protein, UA: NEGATIVE
Spec Grav, UA: <=1.005 — AB (ref 1.010–1.025)
Urobilinogen, UA: 0.2 U/dL
pH, UA: 7 (ref 5.0–8.0)

## 2023-05-05 LAB — URINE CULTURE
MICRO NUMBER:: 15626543
Result:: NO GROWTH
SPECIMEN QUALITY:: ADEQUATE

## 2023-05-06 ENCOUNTER — Encounter: Payer: Self-pay | Admitting: Family Medicine

## 2023-05-06 NOTE — Telephone Encounter (Signed)
Silvio Pate, CMA 05/06/2023  9:21 AM EDT Back to Top    Spoke with patient and she will contact insurance to see if medications are covered at all and let us know via mychart.   Roxanne Gates, CMA 05/03/2023  3:49 PM EDT     Spoke with patient. Pt states she is interested in taking Zepbound or Wegovy and advises she would like pcp to chose he medication.   Lelon Perla Chase, DO 04/29/2023  8:40 PM EDT     Vita d is low---  take vita d 50,000 u weekly #12 and take vita d3 2000 u daily Insulin is high----- we may be able to get the wegovy/ zepbound covered using insulin resistance instead of obesity   FYI

## 2023-06-27 DIAGNOSIS — Z6838 Body mass index (BMI) 38.0-38.9, adult: Secondary | ICD-10-CM | POA: Diagnosis not present

## 2023-06-27 DIAGNOSIS — R051 Acute cough: Secondary | ICD-10-CM | POA: Diagnosis not present

## 2023-06-27 DIAGNOSIS — H66003 Acute suppurative otitis media without spontaneous rupture of ear drum, bilateral: Secondary | ICD-10-CM | POA: Diagnosis not present

## 2023-08-09 ENCOUNTER — Other Ambulatory Visit: Payer: Self-pay | Admitting: Family Medicine

## 2023-08-09 DIAGNOSIS — G43719 Chronic migraine without aura, intractable, without status migrainosus: Secondary | ICD-10-CM

## 2023-09-18 ENCOUNTER — Other Ambulatory Visit: Payer: Self-pay | Admitting: Family Medicine

## 2023-09-18 DIAGNOSIS — G43719 Chronic migraine without aura, intractable, without status migrainosus: Secondary | ICD-10-CM

## 2023-11-08 ENCOUNTER — Encounter: Payer: Self-pay | Admitting: Family Medicine

## 2023-12-09 ENCOUNTER — Encounter: Payer: Self-pay | Admitting: Family Medicine

## 2023-12-09 ENCOUNTER — Other Ambulatory Visit: Payer: Self-pay | Admitting: Family Medicine

## 2023-12-09 MED ORDER — TIRZEPATIDE-WEIGHT MANAGEMENT 2.5 MG/0.5ML ~~LOC~~ SOLN: 2.5000 mg | SUBCUTANEOUS | 0 refills | Status: DC

## 2023-12-13 ENCOUNTER — Encounter: Payer: Self-pay | Admitting: Family Medicine

## 2023-12-13 MED ORDER — TIRZEPATIDE-WEIGHT MANAGEMENT 2.5 MG/0.5ML ~~LOC~~ SOLN: 2.5000 mg | SUBCUTANEOUS | 1 refills | Status: DC

## 2024-01-26 ENCOUNTER — Encounter: Payer: Self-pay | Admitting: Family Medicine

## 2024-01-27 ENCOUNTER — Other Ambulatory Visit: Payer: Self-pay | Admitting: Family Medicine

## 2024-02-03 ENCOUNTER — Other Ambulatory Visit: Payer: Self-pay | Admitting: Family Medicine

## 2024-02-03 DIAGNOSIS — G43719 Chronic migraine without aura, intractable, without status migrainosus: Secondary | ICD-10-CM

## 2024-02-03 MED ORDER — RIZATRIPTAN BENZOATE 10 MG PO TABS: ORAL_TABLET | ORAL | 2 refills | Status: DC

## 2024-02-03 NOTE — Telephone Encounter (Signed)
 Copied from CRM [id number]. Topic: Clinical - Medication Refill >> Feb 03, 2024  1:32 PM Mesmerise C wrote: Medication:  rizatriptan  (MAXALT ) 10 MG tablet  Has the patient contacted their pharmacy? No (Agent: If no, request that the patient contact the pharmacy for the refill. If patient does not wish to contact the pharmacy document the reason why and proceed with request.) (Agent: If yes, when and what did the pharmacy advise?)  This is the patient's preferred pharmacy:  [facility] [address] [place] - [address] [place] [address] Phone: [phone] Fax: [phone]    Is this the correct pharmacy for this prescription? Yes If no, delete pharmacy and type the correct one.   Has the prescription been filled recently? No  Is the patient out of the medication? Yes  Has the patient been seen for an appointment in the last year OR does the patient have an upcoming appointment? Yes  Can we respond through MyChart? Yes  Agent: Please be advised that Rx refills may take up to 3 business days. We ask that you follow-up with your pharmacy.

## 2024-03-21 ENCOUNTER — Other Ambulatory Visit: Payer: Self-pay | Admitting: Family Medicine

## 2024-03-21 DIAGNOSIS — J209 Acute bronchitis, unspecified: Secondary | ICD-10-CM

## 2024-04-12 ENCOUNTER — Other Ambulatory Visit: Payer: Self-pay | Admitting: Family Medicine

## 2024-04-12 DIAGNOSIS — J209 Acute bronchitis, unspecified: Secondary | ICD-10-CM

## 2024-04-24 ENCOUNTER — Encounter: Payer: Self-pay | Admitting: Family Medicine

## 2024-04-24 ENCOUNTER — Ambulatory Visit: Admitting: Family Medicine

## 2024-04-24 VITALS — BP 118/80 | HR 72 | Temp 98.9°F | Resp 16 | Ht 65.0 in | Wt 263.0 lb

## 2024-04-24 DIAGNOSIS — Z0000 Encounter for general adult medical examination without abnormal findings: Secondary | ICD-10-CM

## 2024-04-24 DIAGNOSIS — G43909 Migraine, unspecified, not intractable, without status migrainosus: Secondary | ICD-10-CM | POA: Diagnosis not present

## 2024-04-24 DIAGNOSIS — L509 Urticaria, unspecified: Secondary | ICD-10-CM

## 2024-04-24 DIAGNOSIS — Z780 Asymptomatic menopausal state: Secondary | ICD-10-CM

## 2024-04-24 DIAGNOSIS — Z6837 Body mass index (BMI) 37.0-37.9, adult: Secondary | ICD-10-CM | POA: Diagnosis not present

## 2024-04-24 DIAGNOSIS — E559 Vitamin D deficiency, unspecified: Secondary | ICD-10-CM

## 2024-04-24 DIAGNOSIS — E7849 Other hyperlipidemia: Secondary | ICD-10-CM | POA: Diagnosis not present

## 2024-04-24 DIAGNOSIS — D229 Melanocytic nevi, unspecified: Secondary | ICD-10-CM

## 2024-04-24 DIAGNOSIS — Z1159 Encounter for screening for other viral diseases: Secondary | ICD-10-CM

## 2024-04-24 DIAGNOSIS — R7301 Impaired fasting glucose: Secondary | ICD-10-CM

## 2024-04-24 LAB — LIPID PANEL
Cholesterol: 222 mg/dL — ABNORMAL HIGH (ref 0–200)
HDL: 62.5 mg/dL (ref >39.00)
LDL Cholesterol: 127 mg/dL — ABNORMAL HIGH (ref 0–99)
NonHDL: 159.04
Total CHOL/HDL Ratio: 4
Triglycerides: 160 mg/dL — ABNORMAL HIGH (ref 0.0–149.0)
VLDL: 32 mg/dL (ref 0.0–40.0)

## 2024-04-24 LAB — COMPREHENSIVE METABOLIC PANEL WITH GFR
ALT: 14 U/L (ref 0–35)
AST: 15 U/L (ref 0–37)
Albumin: 4.3 g/dL (ref 3.5–5.2)
Alkaline Phosphatase: 85 U/L (ref 39–117)
BUN: 17 mg/dL (ref 6–23)
CO2: 29 meq/L (ref 19–32)
Calcium: 9.3 mg/dL (ref 8.4–10.5)
Chloride: 104 meq/L (ref 96–112)
Creatinine, Ser: 0.64 mg/dL (ref 0.40–1.20)
GFR: 97.48 mL/min (ref >60.00)
Glucose, Bld: 106 mg/dL — ABNORMAL HIGH (ref 70–99)
Potassium: 4.2 meq/L (ref 3.5–5.1)
Sodium: 140 meq/L (ref 135–145)
Total Bilirubin: 0.4 mg/dL (ref 0.2–1.2)
Total Protein: 6.5 g/dL (ref 6.0–8.3)

## 2024-04-24 LAB — CBC WITH DIFFERENTIAL/PLATELET
Basophils Absolute: 0 K/uL (ref 0.0–0.1)
Basophils Relative: 0.4 % (ref 0.0–3.0)
Eosinophils Absolute: 0.1 K/uL (ref 0.0–0.7)
Eosinophils Relative: 1.9 % (ref 0.0–5.0)
HCT: 46.8 % — ABNORMAL HIGH (ref 36.0–46.0)
Hemoglobin: 15.4 g/dL — ABNORMAL HIGH (ref 12.0–15.0)
Lymphocytes Relative: 23.5 % (ref 12.0–46.0)
Lymphs Abs: 1.5 K/uL (ref 0.7–4.0)
MCHC: 32.9 g/dL (ref 30.0–36.0)
MCV: 86 fl (ref 78.0–100.0)
Monocytes Absolute: 0.4 K/uL (ref 0.1–1.0)
Monocytes Relative: 6.5 % (ref 3.0–12.0)
Neutro Abs: 4.4 K/uL (ref 1.4–7.7)
Neutrophils Relative %: 67.7 % (ref 43.0–77.0)
Platelets: 315 K/uL (ref 150.0–400.0)
RBC: 5.44 Mil/uL — ABNORMAL HIGH (ref 3.87–5.11)
RDW: 14.5 % (ref 11.5–15.5)
WBC: 6.5 K/uL (ref 4.0–10.5)

## 2024-04-24 LAB — TSH: TSH: 1.31 u[IU]/mL (ref 0.35–5.50)

## 2024-04-24 MED ORDER — FLUOXETINE HCL 20 MG PO TABS: 20.0000 mg | ORAL_TABLET | Freq: Every day | ORAL | 3 refills | Status: AC

## 2024-04-24 NOTE — Assessment & Plan Note (Signed)
 Ghm utd Check labs  See AVS  Health Maintenance  Topic Date Due   HIV Screening  Never done   Hepatitis C Screening  Never done   Pneumococcal Vaccine: 50+ Years (1 of 2 - PCV) Never done   Hepatitis B Vaccines 19-59 Average Risk (1 of 3 - 19+ 3-dose series) Never done   Mammogram  02/05/2024   COVID-19 Vaccine (3 - 2025-26 season) 03/13/2024   Influenza Vaccine  10/10/2024 (Originally 02/11/2024)   Colonoscopy  09/13/2026   Cervical Cancer Screening (HPV/Pap Cotest)  02/04/2027   DTaP/Tdap/Td (2 - Td or Tdap) 01/29/2031   Zoster Vaccines- Shingrix   Completed   HPV VACCINES  Aged Out   Meningococcal B Vaccine  Aged Out

## 2024-04-24 NOTE — Assessment & Plan Note (Signed)
?   Cause----  refer to allergy Pt is unable to take antihistamine

## 2024-04-24 NOTE — Progress Notes (Signed)
 Subjective:    Patient ID: Kara King, female    DOB: 1965-08-11, 58 y.o.   MRN: [id number]  Chief Complaint  Patient presents with   Annual Exam    Pt states not fasting     HPI Patient is in today for cpe.  Discussed the use of AI scribe software for clinical note transcription with the patient, who gave verbal consent to proceed.  History of Present Illness Kara King is a 58 year old female who presents for an annual physical exam.  She is not currently taking Zepbound  due to financial constraints but is considering resuming it through a more affordable source. She previously used it in June and July at a dose of 350, resulting in a weight loss of two pounds per month. During that period, she felt better and notes that her knees are painful, especially since her bedroom is upstairs.  She is experiencing menopausal symptoms, including irritability and occasional hot flashes, which are decreasing in frequency. She has previously tried Lexapro for irritability.  She reports persistent skin issues, including red spots and keratosis on her face and back. She has applied antifungal cream without improvement and is seeking a dermatologist for further evaluation. She discussed her stepbrother's history of melanoma.  She has recurrent hives that come and go quickly. She has a history of an adverse reaction to antihistamines, which caused shortness of breath. She avoids beef and pork due to past allergy testing that suggested a link to migraines.  She quit smoking at age 36. Her mother has a history of COPD or emphysema, having quit smoking at age 60. 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  She has not had a recent eye exam and is seeking a new eye doctor. She has a history of needing two colonoscopies in one day due to inadequate preparation.    Past Medical History:  Diagnosis Date    Allergy    Asthma    Constipation    Depression    Migraine headache    SOBOE (shortness of breath on exertion)    Swelling of both lower extremities    Varicose veins    Vitamin D  deficiency     Past Surgical History:  Procedure Laterality Date   G 3 P 3     TUBAL LIGATION      Family History  Adopted: Yes  Problem Relation Age of Onset   COPD Mother    Alcohol abuse Father    Heart attack Brother 52        smoker   Heart disease Brother    Hypertension Brother    Uterine cancer Maternal Grandmother    Cancer Maternal Grandmother    Stroke Paternal Grandmother        > 109   Diabetes Paternal Grandmother    Colon cancer Neg Hx    Esophageal cancer Neg Hx    Rectal cancer Neg Hx    Stomach cancer Neg Hx     Social History   Socioeconomic History   Marital status: Single    Spouse name: Not on file   Number of children: 3   Years of education: Not on file   Highest education level: Bachelor's degree (e.g., BA, AB, BS)  Occupational History   Occupation: Print production planner & sales    Comment: [employer]  Tobacco Use   Smoking status: Former  Current packs/day: 0.00    Types: Cigarettes    Quit date: 01/03/2011    Years since quitting: 13.3   Smokeless tobacco: Never   Tobacco comments:    smoked age [age] (not with pregnancies , up to 2 ppd  Vaping Use   Vaping status: Never Used  Substance and Sexual Activity   Alcohol use: Yes    Comment: 1-2 times a month   Drug use: No   Sexual activity: Never  Other Topics Concern   Not on file  Social History Narrative   Exericse---  4x a week    Social Drivers of Health   Financial Resource Strain: Medium Risk (12/17/2022)   Overall Financial Resource Strain (CARDIA)    Difficulty of Paying Living Expenses: Somewhat hard  Food Insecurity: No Food Insecurity (12/17/2022)   Hunger Vital Sign    Worried About Running Out of Food in the Last Year: Never true    Ran Out of Food in the Last Year: Never true   Transportation Needs: No Transportation Needs (12/17/2022)   PRAPARE - Administrator, Civil Service (Medical): No    Lack of Transportation (Non-Medical): No  Physical Activity: Insufficiently Active (12/17/2022)   Exercise Vital Sign    Days of Exercise per Week: 3 days    Minutes of Exercise per Session: 30 min  Stress: No Stress Concern Present (12/17/2022)   [employer] of Occupational Health - Occupational Stress Questionnaire    Feeling of Stress : Not at all  Social Connections: Moderately Integrated (12/17/2022)   Social Connection and Isolation Panel    Frequency of Communication with Friends and Family: More than three times a week    Frequency of Social Gatherings with Friends and Family: Three times a week    Attends Religious Services: More than 4 times per year    Active Member of Clubs or Organizations: Yes    Attends Engineer, structural: More than 4 times per year    Marital Status: Divorced  Catering manager Violence: Not on file    Outpatient Medications Prior to Visit  Medication Sig Dispense Refill   albuterol  (VENTOLIN  HFA) 108 (90 Base) MCG/ACT inhaler Inhale 2 puffs into the lungs every 6 (six) hours as needed for wheezing or shortness of breath. 18 g 5   Multiple Vitamin (MULTIVITAMIN) tablet Take 1 tablet by mouth daily.     NONFORMULARY OR COMPOUNDED ITEM Compression stocking 20-70mm/hg #1  As directed 1 each 0   rizatriptan  (MAXALT ) 10 MG tablet TAKE 1 TABLET BY MOUTH ONCE DAILY AS NEEDED FOR HEADACHE, MAY REPEAT IN 2 HOURS IF NEEDED.**MAX 2/24 HOURS** 12 tablet 2   topiramate  (TOPAMAX ) 25 MG tablet 1 po qd x 1 week then 1 po bid x 1 week then 1 po qam and 2 po qhs 42 tablet 0   topiramate  (TOPAMAX ) 50 MG tablet Take 1 tablet (50 mg total) by mouth 2 (two) times daily. 180 tablet 3   Vitamin D , Ergocalciferol , (DRISDOL ) 1.25 MG (50000 UNIT) CAPS capsule 1 capsule by mouth every 7 days. 12 capsule 3   Vitamin D , Ergocalciferol , (DRISDOL )  1.25 MG (50000 UNIT) CAPS capsule Take 1 capsule (50,000 Units total) by mouth every 7 (seven) days. 12 capsule 1   ZEPBOUND  2.5 MG/0.5ML injection vial INJECT 0.5 ML (2.5 MG) UNDER THE SKIN ONCE WEEKLY (0.5ML= 50 UNITS) 2 mL 1   No facility-administered medications prior to visit.    Allergies  Allergen Reactions  Wellbutrin  [Bupropion ]     01/30/13 dyspnea (MyChart message)   Effexor  [Venlafaxine ]     Confusion    Review of Systems  Constitutional:  Negative for chills, fever and malaise/fatigue.  HENT:  Negative for congestion and hearing loss.   Eyes:  Negative for discharge.  Respiratory:  Negative for cough, sputum production and shortness of breath.   Cardiovascular:  Negative for chest pain, palpitations and leg swelling.  Gastrointestinal:  Negative for abdominal pain, blood in stool, constipation, diarrhea, heartburn, nausea and vomiting.  Genitourinary:  Negative for dysuria, frequency, hematuria and urgency.  Musculoskeletal:  Negative for back pain, falls and myalgias.  Skin:  Negative for rash.  Neurological:  Negative for dizziness, sensory change, loss of consciousness, weakness and headaches.  Endo/Heme/Allergies:  Negative for environmental allergies. Does not bruise/bleed easily.  Psychiatric/Behavioral:  Negative for depression and suicidal ideas. The patient is not nervous/anxious and does not have insomnia.        Objective:    Physical Exam  BP 118/80 (BP Location: Right Arm, Patient Position: Sitting, Cuff Size: Large)   Pulse 72   Temp 98.9 F (37.2 C) (Oral)   Resp 16   Ht 5' 5 (1.651 m)   Wt 263 lb (119.3 kg)   LMP 09/30/2016   SpO2 98%   BMI 43.77 kg/m  Wt Readings from Last 3 Encounters:  04/24/24 263 lb (119.3 kg)  04/22/23 259 lb 6.4 oz (117.7 kg)  12/18/22 247 lb 12.8 oz (112.4 kg)    Diabetic Foot Exam - Simple   No data filed    Lab Results  Component Value Date   WBC 6.7 04/22/2023   HGB 13.9 04/22/2023   HCT 43.3  04/22/2023   PLT 313.0 04/22/2023   GLUCOSE 109 (H) 04/22/2023   CHOL 210 (H) 04/22/2023   TRIG 157.0 (H) 04/22/2023   HDL 66.80 04/22/2023   LDLCALC 112 (H) 04/22/2023   ALT 13 04/22/2023   AST 15 04/22/2023   NA 142 04/22/2023   K 3.8 04/22/2023   CL 104 04/22/2023   CREATININE 0.62 04/22/2023   BUN 23 04/22/2023   CO2 31 04/22/2023   TSH 1.15 04/22/2023   HGBA1C 5.6 04/22/2023    Lab Results  Component Value Date   TSH 1.15 04/22/2023   Lab Results  Component Value Date   WBC 6.7 04/22/2023   HGB 13.9 04/22/2023   HCT 43.3 04/22/2023   MCV 88.4 04/22/2023   PLT 313.0 04/22/2023   Lab Results  Component Value Date   NA 142 04/22/2023   K 3.8 04/22/2023   CO2 31 04/22/2023   GLUCOSE 109 (H) 04/22/2023   BUN 23 04/22/2023   CREATININE 0.62 04/22/2023   BILITOT 0.3 04/22/2023   ALKPHOS 64 04/22/2023   AST 15 04/22/2023   ALT 13 04/22/2023   PROT 5.7 (L) 04/22/2023   ALBUMIN 4.0 04/22/2023   CALCIUM 9.3 04/22/2023   EGFR 103 07/03/2021   GFR 98.92 04/22/2023   Lab Results  Component Value Date   CHOL 210 (H) 04/22/2023   Lab Results  Component Value Date   HDL 66.80 04/22/2023   Lab Results  Component Value Date   LDLCALC 112 (H) 04/22/2023   Lab Results  Component Value Date   TRIG 157.0 (H) 04/22/2023   Lab Results  Component Value Date   CHOLHDL 3 04/22/2023   Lab Results  Component Value Date   HGBA1C 5.6 04/22/2023       Assessment &  Plan:  Preventative health care Assessment & Plan: Ghm utd Check labs  See AVS  Health Maintenance  Topic Date Due   HIV Screening  Never done   Hepatitis C Screening  Never done   Pneumococcal Vaccine: 50+ Years (1 of 2 - PCV) Never done   Hepatitis B Vaccines 19-59 Average Risk (1 of 3 - 19+ 3-dose series) Never done   Mammogram  02/05/2024   COVID-19 Vaccine (3 - 2025-26 season) 03/13/2024   Influenza Vaccine  10/10/2024 (Originally 02/11/2024)   Colonoscopy  09/13/2026   Cervical Cancer  Screening (HPV/Pap Cotest)  02/04/2027   DTaP/Tdap/Td (2 - Td or Tdap) 01/29/2031   Zoster Vaccines- Shingrix   Completed   HPV VACCINES  Aged Out   Meningococcal B Vaccine  Aged Out     Orders: -     CBC with Differential/Platelet -     Comprehensive metabolic panel with GFR -     Lipid panel -     TSH  Vitamin D  deficiency  BMI 37.0-37.9, adult  Other hyperlipidemia  Migraine without status migrainosus, not intractable, unspecified migraine type  Impaired fasting glucose  Urticaria Assessment & Plan: ? Cause----  refer to allergy Pt is unable to take antihistamine   Orders: -     Ambulatory referral to Dermatology -     Ambulatory referral to Allergy -     Food Allergy Profile -     Alpha-Gal Panel  Nevus Assessment & Plan: ? Sk But pt has multiple and wishes to see dermatology  Orders: -     Ambulatory referral to Dermatology  Need for hepatitis C screening test -     Hepatitis C antibody  Menopause -     FLUoxetine HCl; Take 1 tablet (20 mg total) by mouth daily.  Dispense: 90 tablet; Refill: 3   Assessment and Plan Assessment & Plan Obesity   Obesity management was previously attempted with Zepbound , which was discontinued due to cost. Resuming treatment with a compounded version from [facility] is under consideration for financial reasons.  Menopausal symptoms   She experiences menopausal symptoms such as irritability and occasional hot flashes. Hormone therapy risks and benefits were discussed, particularly given her family history of breast cancer. Non-hormonal treatments for mood symptoms are being considered. Hormone therapy is typically recommended for severe hot flashes that impact quality of life. A referral to a gynecologist for further hormone therapy discussion is advised. Start Prozac to address mood symptoms related to menopause.  Depression   Current depressive symptoms include irritability. Prozac is recommended as a treatment option, with a  discussion of potential side effects like headache. Prozac is well-tolerated and effective for menopausal mood symptoms. Initiate Prozac and follow up in one month to assess response, with the option for a virtual visit.  Chronic urticaria   She has chronic urticaria with intermittent hives and a history of adverse reactions to antihistamines. An allergy component is suspected, and a referral to an allergist for further evaluation is considered. Order an allergy panel and refer to an allergist.  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 These lesions are generally benign but can be removed if symptomatic. Insurance may not cover removal unless lesions are irritated or bleeding. Refer to a dermatologist for evaluation of skin lesions.   Stephen Turnbaugh R Lowne Chase, DO

## 2024-04-24 NOTE — Assessment & Plan Note (Signed)
?   Sk But pt has multiple and wishes to see dermatology

## 2024-04-27 LAB — FOOD ALLERGY PROFILE

## 2024-04-27 LAB — ALPHA-GAL PANEL
Allergen, Mutton, f88: <0.1 kU/L
Allergen, Pork, f26: <0.1 kU/L
Beef: <0.1 kU/L
CLASS: 0
CLASS: 0
Class: 0
GALACTOSE-ALPHA-1,3-GALACTOSE IGE*: 0.14 kU/L — ABNORMAL HIGH (ref <0.10)

## 2024-04-27 LAB — HEPATITIS C ANTIBODY: Hepatitis C Ab: NONREACTIVE

## 2024-04-27 LAB — INTERPRETATION:

## 2024-05-03 ENCOUNTER — Ambulatory Visit: Payer: Self-pay | Admitting: Family Medicine

## 2024-06-06 ENCOUNTER — Ambulatory Visit
Admission: RE | Admit: 2024-06-06 | Discharge: 2024-06-06 | Disposition: A | Discharge status: Home / Self Care | Source: Ambulatory Visit | Attending: Internal Medicine | Admitting: Internal Medicine

## 2024-06-06 ENCOUNTER — Ambulatory Visit: Payer: Self-pay

## 2024-06-06 ENCOUNTER — Other Ambulatory Visit: Payer: Self-pay

## 2024-06-06 VITALS — BP 119/78 | HR 60 | Temp 97.8°F | Resp 18

## 2024-06-06 DIAGNOSIS — R350 Frequency of micturition: Secondary | ICD-10-CM | POA: Insufficient documentation

## 2024-06-06 DIAGNOSIS — R300 Dysuria: Secondary | ICD-10-CM | POA: Diagnosis not present

## 2024-06-06 DIAGNOSIS — N3000 Acute cystitis without hematuria: Secondary | ICD-10-CM | POA: Diagnosis not present

## 2024-06-06 LAB — POCT URINE DIPSTICK
Bilirubin, UA: NEGATIVE
Glucose, UA: NEGATIVE mg/dL
Ketones, POC UA: NEGATIVE mg/dL
Nitrite, UA: NEGATIVE
POC PROTEIN,UA: NEGATIVE
Spec Grav, UA: <=1.005 — AB (ref 1.010–1.025)
Urobilinogen, UA: 0.2 U/dL
pH, UA: 6 (ref 5.0–8.0)

## 2024-06-06 MED ORDER — FLUCONAZOLE 150 MG PO TABS: 150.0000 mg | ORAL_TABLET | Freq: Every day | ORAL | 0 refills | Status: AC

## 2024-06-06 MED ORDER — SULFAMETHOXAZOLE-TRIMETHOPRIM 800-160 MG PO TABS: 1.0000 | ORAL_TABLET | Freq: Two times a day (BID) | ORAL | 0 refills | Status: AC

## 2024-06-06 NOTE — ED Provider Notes (Signed)
 [facility]    CSN: [id number] Arrival date & time: 06/06/24  1153      History   Chief Complaint Chief Complaint  Patient presents with   Urinary Frequency    Dysuria - Entered by patient    HPI Kara King is a 58 y.o. female.   58 year old female presents urgent care with complaints of urinary frequency and dysuria.  Her symptoms started this morning when she woke up.  She is drinking plenty of water and normally drinks plenty of water.  She denies any recent illnesses.  She denies any abdominal pain, nausea, vomiting, fevers or recurrent urinary tract infections.  She has not had any changes in medications.   Urinary Frequency Pertinent negatives include no chest pain, no abdominal pain and no shortness of breath.    Past Medical History:  Diagnosis Date   Allergy     Asthma    Constipation    Depression    Migraine headache    SOBOE (shortness of breath on exertion)    Swelling of both lower extremities    Varicose veins    Vitamin D  deficiency     Patient Active Problem List   Diagnosis Date Noted   Urticaria 04/24/2024   Nevus 04/24/2024   BMI 37.0-37.9, adult-current bmi 37.8 08/13/2022   Morbid obesity (HCC)-start bmi 39.94 08/13/2022   Stress and adjustment reaction 03/23/2022   Impaired fasting glucose with polypagia 12/03/2021   Eating disorder 11/05/2021   Class 2 severe obesity with serious comorbidity and body mass index (BMI) of 39.0 to 39.9 in adult 07/28/2021   Polyphagia 07/28/2021   Preventative health care 01/28/2021   Nonintractable headache 11/28/2020   At risk for side effect of medication 11/28/2020   Hypernatremia 11/12/2020   At risk for dehydration 09/30/2020   At risk for impaired metabolic function 09/16/2020   Other hyperlipidemia 08/21/2020   Mild dehydration 08/21/2020   At risk for heart disease 08/21/2020   Other fatigue 08/08/2020   SOBOE (shortness of breath on exertion) 08/08/2020   Peripheral edema  08/08/2020   Vitamin D  deficiency 08/08/2020   Other constipation 08/08/2020   Depression 08/08/2020   At risk for malnutrition 08/08/2020   Intractable chronic migraine without aura and without status migrainosus 07/31/2020   Acute bronchitis 07/31/2020   Lower extremity edema 07/31/2020   Symptomatic spider varicose vein 07/31/2020   Depression, major, single episode, mild 10/17/2016   Bradycardia 09/21/2016   Ventricular bigeminy 01/02/2013   Migraine without status migrainosus, not intractable 01/11/2008   ASTHMA 01/03/2007   Asthma 01/03/2007    Past Surgical History:  Procedure Laterality Date   G 3 P 3     TUBAL LIGATION      OB History     Gravida  3   Para  3   Term      Preterm      AB      Living         SAB      IAB      Ectopic      Multiple      Live Births               Home Medications    Prior to Admission medications   Medication Sig Start Date End Date Taking? Authorizing Provider  fluconazole  (DIFLUCAN ) 150 MG tablet Take 1 tablet (150 mg total) by mouth daily for 2 days. take 1 tablet at first sign of a yeast  infection and then repeat in 3 days 06/06/24 06/08/24 Yes Dary Dilauro A, PA-C  sulfamethoxazole -trimethoprim  (BACTRIM  DS) 800-160 MG tablet Take 1 tablet by mouth 2 (two) times daily for 3 days. 06/06/24 06/09/24 Yes Fayola Meckes A, PA-C  albuterol  (VENTOLIN  HFA) 108 (90 Base) MCG/ACT inhaler Inhale 2 puffs into the lungs every 6 (six) hours as needed for wheezing or shortness of breath. 04/12/24   Antonio Cyndee Jamee JONELLE, DO  FLUoxetine  (PROZAC ) 20 MG tablet Take 1 tablet (20 mg total) by mouth daily. 04/24/24   Lowne Chase, Yvonne R, DO  Multiple Vitamin (MULTIVITAMIN) tablet Take 1 tablet by mouth daily.    [provider]  NONFORMULARY OR COMPOUNDED ITEM Compression stocking 20-74mm/hg #1  As directed 07/30/20   Antonio Cyndee, Jamee JONELLE, DO  rizatriptan  (MAXALT ) 10 MG tablet TAKE 1 TABLET BY MOUTH ONCE DAILY AS  NEEDED FOR HEADACHE, MAY REPEAT IN 2 HOURS IF NEEDED.**MAX 2/24 HOURS** 02/03/24   Antonio Cyndee Jamee R, DO  topiramate  (TOPAMAX ) 25 MG tablet 1 po qd x 1 week then 1 po bid x 1 week then 1 po qam and 2 po qhs 12/18/22   Lowne Chase, Jamee JONELLE, DO  topiramate  (TOPAMAX ) 50 MG tablet Take 1 tablet (50 mg total) by mouth 2 (two) times daily. 02/09/22   Lowne Chase, Yvonne R, DO  Vitamin D , Ergocalciferol , (DRISDOL ) 1.25 MG (50000 UNIT) CAPS capsule 1 capsule by mouth every 7 days. 04/22/23   Antonio Cyndee Jamee R, DO  Vitamin D , Ergocalciferol , (DRISDOL ) 1.25 MG (50000 UNIT) CAPS capsule Take 1 capsule (50,000 Units total) by mouth every 7 (seven) days. 04/30/23   Antonio Cyndee Jamee R, DO  ZEPBOUND  2.5 MG/0.5ML injection vial INJECT 0.5 ML (2.5 MG) UNDER THE SKIN ONCE WEEKLY (0.5ML= 50 UNITS) 01/29/24   Douglass Kenney NOVAK, FNP    Family History Family History  Adopted: Yes  Problem Relation Age of Onset   COPD Mother    Alcohol abuse Father    Heart attack Brother 36        smoker   Heart disease Brother    Hypertension Brother    Uterine cancer Maternal Grandmother    Cancer Maternal Grandmother    Stroke Paternal Grandmother        > 46   Diabetes Paternal Grandmother    Colon cancer Neg Hx    Esophageal cancer Neg Hx    Rectal cancer Neg Hx    Stomach cancer Neg Hx     Social History Social History   Tobacco Use   Smoking status: Former    Current packs/day: 0.00    Types: Cigarettes    Quit date: 01/03/2011    Years since quitting: 13.4   Smokeless tobacco: Never   Tobacco comments:    smoked age [age] (not with pregnancies , up to 2 ppd  Vaping Use   Vaping status: Never Used  Substance Use Topics   Alcohol use: Yes    Comment: 1-2 times a month   Drug use: No     Allergies   Wellbutrin  [bupropion ] and Effexor  [venlafaxine ]   Review of Systems Review of Systems  Constitutional:  Negative for chills and fever.  HENT:  Negative for ear pain and sore throat.   Eyes:   Negative for pain and visual disturbance.  Respiratory:  Negative for cough and shortness of breath.   Cardiovascular:  Negative for chest pain and palpitations.  Gastrointestinal:  Negative for abdominal pain and vomiting.  Genitourinary:  Positive for dysuria and frequency. Negative for hematuria.  Musculoskeletal:  Negative for arthralgias and back pain.  Skin:  Negative for color change and rash.  Neurological:  Negative for seizures and syncope.  All other systems reviewed and are negative.    Physical Exam Triage Vital Signs ED Triage Vitals  Encounter Vitals Group     BP 06/06/24 1200 119/78     Girls Systolic BP Percentile --      Girls Diastolic BP Percentile --      Boys Systolic BP Percentile --      Boys Diastolic BP Percentile --      Pulse Rate 06/06/24 1200 60     Resp 06/06/24 1200 18     Temp 06/06/24 1200 97.8 F (36.6 C)     Temp Source 06/06/24 1200 Oral     SpO2 06/06/24 1200 97 %     Weight --      Height --      Head Circumference --      Peak Flow --      Pain Score 06/06/24 1158 0     Pain Loc --      Pain Education --      Exclude from Growth Chart --    No data found.  Updated Vital Signs BP 119/78   Pulse 60   Temp 97.8 F (36.6 C) (Oral)   Resp 18   LMP 09/30/2016   SpO2 97%   Visual Acuity Right Eye Distance:   Left Eye Distance:   Bilateral Distance:    Right Eye Near:   Left Eye Near:    Bilateral Near:     Physical Exam Vitals and nursing note reviewed.  Constitutional:      General: She is not in acute distress.    Appearance: She is well-developed.  HENT:     Head: Normocephalic and atraumatic.  Eyes:     Conjunctiva/sclera: Conjunctivae normal.  Cardiovascular:     Rate and Rhythm: Normal rate and regular rhythm.     Heart sounds: No murmur heard. Pulmonary:     Effort: Pulmonary effort is normal. No respiratory distress.     Breath sounds: Normal breath sounds.  Abdominal:     Palpations: Abdomen is soft.      Tenderness: There is no abdominal tenderness.  Musculoskeletal:        General: No swelling.     Cervical back: Neck supple.  Skin:    General: Skin is warm and dry.     Capillary Refill: Capillary refill takes less than 2 seconds.  Neurological:     Mental Status: She is alert.  Psychiatric:        Mood and Affect: Mood normal.      UC Treatments / Results  Labs (all labs ordered are listed, but only abnormal results are displayed) Labs Reviewed  POCT URINE DIPSTICK - Abnormal; Notable for the following components:      Result Value   Color, UA colorless (*)    Spec Grav, UA <=1.005 (*)    Blood, UA trace-intact (*)    Leukocytes, UA Trace (*)    All other components within normal limits  URINE CULTURE    EKG   Radiology No results found.  Procedures Procedures (including critical care time)  Medications Ordered in UC Medications - No data to display  Initial Impression / Assessment and Plan / UC Course  I have reviewed the triage vital signs and  the nursing notes.  Pertinent labs & imaging results that were available during my care of the patient were reviewed by me and considered in my medical decision making (see chart for details).     Urinary frequency - Plan: POCT URINE DIPSTICK, POCT URINE DIPSTICK, Urine Culture, Urine Culture  Dysuria - Plan: POCT URINE DIPSTICK, POCT URINE DIPSTICK, Urine Culture, Urine Culture  Acute cystitis without hematuria   Urinalysis shows some blood as well as some leukocytes.  This is borderline for urinary tract infection however combined with the symptoms present and early onset this is likely a urinary tract infection.  We will empirically treat for a urinary tract infection.  We will send this off for a urinary culture to ensure that appropriate treatment is being done.  We will treat with the following: Sulfamethoxazole -trimethoprim  (Bactrim  DS) 800/160 mg twice daily for 3 days.  This is an antibiotic.  Take this with  food.  Diflucan  150 mg take 1 tablet at first signs of a yeast infection and then repeat in 3 days. Make sure to stay hydrated by drinking plenty of water. Return to urgent care or PCP if symptoms worsen or fail to resolve.    Final Clinical Impressions(s) / UC Diagnoses   Final diagnoses:  Urinary frequency  Dysuria  Acute cystitis without hematuria     Discharge Instructions      Urinalysis shows some blood as well as some leukocytes.  This is borderline for urinary tract infection however combined with the symptoms present and early onset this is likely a urinary tract infection.  We will empirically treat for a urinary tract infection.  We will send this off for a urinary culture to ensure that appropriate treatment is being done.  We will treat with the following: Sulfamethoxazole -trimethoprim  (Bactrim  DS) 800/160 mg twice daily for 3 days.  This is an antibiotic.  Take this with food.  Diflucan  150 mg take 1 tablet at first signs of a yeast infection and then repeat in 3 days. Make sure to stay hydrated by drinking plenty of water. Return to urgent care or PCP if symptoms worsen or fail to resolve.       ED Prescriptions     Medication Sig Dispense Auth. Provider   sulfamethoxazole -trimethoprim  (BACTRIM  DS) 800-160 MG tablet Take 1 tablet by mouth 2 (two) times daily for 3 days. 6 tablet Teresa Norris A, PA-C   fluconazole  (DIFLUCAN ) 150 MG tablet Take 1 tablet (150 mg total) by mouth daily for 2 days. take 1 tablet at first sign of a yeast infection and then repeat in 3 days 2 tablet Teresa Norris LABOR, PA-C      PDMP not reviewed this encounter.   Teresa Norris LABOR, PA-C 06/06/24 1227

## 2024-06-06 NOTE — ED Triage Notes (Signed)
 Pt c/o frequent urination and dysuria started this morning.

## 2024-06-06 NOTE — Telephone Encounter (Signed)
 FYI Only or Action Required?: FYI only for provider: Going to UC.  Patient was last seen in primary care on 04/24/2024 by Antonio Meth, Jamee SAUNDERS, DO.  Called Nurse Triage reporting Dysuria and Urinary Frequency.  Symptoms began today.  Interventions attempted: Nothing.  Symptoms are: stable.  Triage Disposition: See Physician Within 24 Hours  Patient/caregiver understands and will follow disposition?: Yes Reason for Disposition  Age > 50 years  Answer Assessment - Initial Assessment Questions Next available appointment tomorrow, advised patient to UC if she'd like to be seen sooner. Set up appointment at [facility] for patient at 12pm today.  1. SEVERITY: How bad is the pain?  (e.g., Scale 1-10; mild, moderate, or severe)     Moderate  2. FREQUENCY: How many times have you had painful urination today?      4  3. PATTERN: Is pain present every time you urinate or just sometimes?      Every time  4. ONSET: When did the painful urination start?      This morning  5. FEVER: Do you have a fever? If Yes, ask: What is your temperature, how was it measured, and when did it start?     Denies  6. PAST UTI: Have you had a urine infection before? If Yes, ask: When was the last time? and What happened that time?      Unsure  7. CAUSE: What do you think is causing the painful urination?  (e.g., UTI, scratch, Herpes sore)     UTI  8. OTHER SYMPTOMS: Do you have any other symptoms? (e.g., blood in urine, flank pain, genital sores, urgency, vaginal discharge)     Denies  Protocols used: Urination Pain - [facility]  Copied from CRM #[id number]. Topic: Clinical - Red Word Triage >> Jun 06, 2024  9:15 AM Eva FALCON wrote: Red Word that prompted transfer to Nurse Triage: Possible UTI, super uncomfortable, frequent bathroom use, burning with urination.

## 2024-06-06 NOTE — Discharge Instructions (Addendum)
 Urinalysis shows some blood as well as some leukocytes.  This is borderline for urinary tract infection however combined with the symptoms present and early onset this is likely a urinary tract infection.  We will empirically treat for a urinary tract infection.  We will send this off for a urinary culture to ensure that appropriate treatment is being done.  We will treat with the following: Sulfamethoxazole -trimethoprim  (Bactrim  DS) 800/160 mg twice daily for 3 days.  This is an antibiotic.  Take this with food.  Diflucan  150 mg take 1 tablet at first signs of a yeast infection and then repeat in 3 days. Make sure to stay hydrated by drinking plenty of water. Return to urgent care or PCP if symptoms worsen or fail to resolve.

## 2024-06-07 LAB — URINE CULTURE: Culture: NO GROWTH

## 2024-06-10 ENCOUNTER — Telehealth: Payer: Self-pay

## 2024-06-12 ENCOUNTER — Ambulatory Visit (HOSPITAL_COMMUNITY): Payer: Self-pay

## 2024-06-26 ENCOUNTER — Other Ambulatory Visit: Payer: Self-pay

## 2024-06-26 DIAGNOSIS — G43719 Chronic migraine without aura, intractable, without status migrainosus: Secondary | ICD-10-CM

## 2024-06-26 MED ORDER — RIZATRIPTAN BENZOATE 10 MG PO TABS: ORAL_TABLET | ORAL | 2 refills | Status: AC

## 2024-07-21 ENCOUNTER — Other Ambulatory Visit: Payer: Self-pay

## 2024-07-21 MED ORDER — ALBUTEROL SULFATE HFA 108 (90 BASE) MCG/ACT IN AERS: 2.0000 | INHALATION_SPRAY | Freq: Four times a day (QID) | RESPIRATORY_TRACT | 5 refills | Status: AC | PRN

## 2024-12-06 ENCOUNTER — Ambulatory Visit: Admitting: Physician Assistant
